# Patient Record
Sex: Female | Born: 1970 | Race: White | Hispanic: No | Marital: Married | Smoking: Former smoker
Health system: Southern US, Community
[De-identification: ages and names within clinical notes are randomized; demographics above are authoritative.]

## PROBLEM LIST (undated history)

## (undated) DIAGNOSIS — E079 Disorder of thyroid, unspecified: Secondary | ICD-10-CM

## (undated) DIAGNOSIS — E039 Hypothyroidism, unspecified: Secondary | ICD-10-CM

## (undated) HISTORY — PX: TONSILLECTOMY: SUR1361

## (undated) HISTORY — PX: WISDOM TOOTH EXTRACTION: SHX21

---

## 2011-08-02 ENCOUNTER — Inpatient Hospital Stay (HOSPITAL_COMMUNITY): Payer: BC Managed Care – PPO

## 2011-08-02 ENCOUNTER — Inpatient Hospital Stay (HOSPITAL_COMMUNITY)
Admission: EM | Admit: 2011-08-02 | Discharge: 2011-08-02 | DRG: 254 | Disposition: A | Discharge status: Home / Self Care | Payer: BC Managed Care – PPO | Attending: Emergency Medicine | Admitting: Emergency Medicine

## 2011-08-02 ENCOUNTER — Emergency Department (HOSPITAL_COMMUNITY): Payer: BC Managed Care – PPO

## 2011-08-02 ENCOUNTER — Encounter (HOSPITAL_COMMUNITY): Payer: Self-pay | Admitting: *Deleted

## 2011-08-02 DIAGNOSIS — W010XXA Fall on same level from slipping, tripping and stumbling without subsequent striking against object, initial encounter: Secondary | ICD-10-CM | POA: Diagnosis present

## 2011-08-02 DIAGNOSIS — X500XXA Overexertion from strenuous movement or load, initial encounter: Secondary | ICD-10-CM | POA: Diagnosis present

## 2011-08-02 DIAGNOSIS — Y9289 Other specified places as the place of occurrence of the external cause: Secondary | ICD-10-CM

## 2011-08-02 DIAGNOSIS — S82853A Displaced trimalleolar fracture of unspecified lower leg, initial encounter for closed fracture: Principal | ICD-10-CM | POA: Diagnosis present

## 2011-08-02 DIAGNOSIS — Z880 Allergy status to penicillin: Secondary | ICD-10-CM

## 2011-08-02 DIAGNOSIS — S82852A Displaced trimalleolar fracture of left lower leg, initial encounter for closed fracture: Secondary | ICD-10-CM

## 2011-08-02 HISTORY — DX: Disorder of thyroid, unspecified: E07.9

## 2011-08-02 MED ORDER — HYDROMORPHONE HCL PF 1 MG/ML IJ SOLN
1.0000 mg | Freq: Once | INTRAMUSCULAR | Status: AC
  Administered 2011-08-02: 1 mg via INTRAVENOUS
  Filled 2011-08-02 (×2): qty 1

## 2011-08-02 MED ORDER — HYDROMORPHONE HCL PF 1 MG/ML IJ SOLN: 1.0000 mg | Freq: Once | INTRAMUSCULAR | Status: DC

## 2011-08-02 MED ORDER — OXYCODONE-ACETAMINOPHEN 5-325 MG PO TABS: 1.0000 | ORAL_TABLET | Freq: Four times a day (QID) | ORAL | Status: AC | PRN

## 2011-08-02 MED ORDER — FENTANYL CITRATE 0.05 MG/ML IJ SOLN
50.0000 ug | Freq: Once | INTRAMUSCULAR | Status: AC
  Administered 2011-08-02: 50 ug via INTRAVENOUS

## 2011-08-02 MED ORDER — FENTANYL CITRATE 0.05 MG/ML IJ SOLN
INTRAMUSCULAR | Status: AC
  Filled 2011-08-02: qty 2

## 2011-08-02 MED ORDER — HYDROMORPHONE HCL PF 2 MG/ML IJ SOLN
2.0000 mg | Freq: Once | INTRAMUSCULAR | Status: AC
  Administered 2011-08-02: 2 mg via INTRAVENOUS
  Filled 2011-08-02: qty 1

## 2011-08-02 MED ORDER — SODIUM CHLORIDE 0.9 % IV SOLN: INTRAVENOUS | Status: DC

## 2011-08-02 MED ORDER — HYDROMORPHONE HCL PF 1 MG/ML IJ SOLN
1.0000 mg | INTRAMUSCULAR | Status: DC | PRN
  Administered 2011-08-02: 1 mg via INTRAVENOUS
  Filled 2011-08-02: qty 1

## 2011-08-02 MED ORDER — ONDANSETRON HCL 4 MG/2ML IJ SOLN
4.0000 mg | Freq: Once | INTRAMUSCULAR | Status: AC
  Administered 2011-08-02: 4 mg via INTRAVENOUS
  Filled 2011-08-02: qty 2

## 2011-08-02 MED ORDER — ONDANSETRON HCL 4 MG/2ML IJ SOLN
4.0000 mg | Freq: Once | INTRAMUSCULAR | Status: AC
  Administered 2011-08-02: 4 mg via INTRAVENOUS
  Filled 2011-08-02 (×2): qty 2

## 2011-08-02 MED ORDER — ONDANSETRON HCL 4 MG/2ML IJ SOLN: 4.0000 mg | Freq: Three times a day (TID) | INTRAMUSCULAR | Status: DC | PRN

## 2011-08-02 NOTE — ED Notes (Signed)
MD at bedside. 

## 2011-08-02 NOTE — Progress Notes (Signed)
Orthopedic Tech Progress Note Patient Details:  Raven Bradshaw May 21, 1970 [id number]  Ortho Devices Type of Ortho Device: Short leg splint Ortho Device/Splint Interventions: Application   Cammer, Mickie Bail 08/02/2011, 1:46 PM

## 2011-08-02 NOTE — ED Notes (Signed)
Pt reports walking down embankment, tripped, twisted left ankle. Obvious deformity. 20G IV L Hand. Pt given Fentanyl 100 mg IVP. BP 120/80 HR 70.

## 2011-08-02 NOTE — Discharge Instructions (Signed)
You have a fractured left ankle.  This is an unstable fracture.  Please do not bear any weight on that ankle.  Use crutches to move around.  Keep your leg elevated above your heart when laying down.  Apply ice to the front of your ankle at home.  Call and follow up with Dr. Lajoyce Corners tomorrow for further care.  Take pain medication as prescribed.    Ankle Fracture A fracture is a break in the bone. A cast or splint is used to protect and keep your injured bone from moving.  HOME CARE INSTRUCTIONS   Use your crutches as directed.   To lessen the swelling, keep the injured leg elevated while sitting or lying down.   Apply ice to the injury for 15 to 20 minutes, 3 to 4 times per day while awake for 2 days. Put the ice in a plastic bag and place a thin towel between the bag of ice and your cast.   If you have a plaster or fiberglass cast:   Do not try to scratch the skin under the cast using sharp or pointed objects.   Check the skin around the cast every day. You may put lotion on any red or sore areas.   Keep your cast dry and clean.   If you have a plaster splint:   Wear the splint as directed.   You may loosen the elastic around the splint if your toes become numb, tingle, or turn cold or blue.   Do not put pressure on any part of your cast or splint; it may break. Rest your cast only on a pillow the first 24 hours until it is fully hardened.   Your cast or splint can be protected during bathing with a plastic bag. Do not lower the cast or splint into water.   Take medications as directed by your caregiver. Only take over-the-counter or prescription medicines for pain, discomfort, or fever as directed by your caregiver.   Do not drive a vehicle until your caregiver specifically tells you it is safe to do so.   If your caregiver has given you a follow-up appointment, it is very important to keep that appointment. Not keeping the appointment could result in a chronic or permanent injury,  pain, and disability. If there is any problem keeping the appointment, you must call back to this facility for assistance.  SEEK IMMEDIATE MEDICAL CARE IF:   Your cast gets damaged or breaks.   You have continued severe pain or more swelling than you did before the cast was put on.   Your skin or toenails below the injury turn blue or gray, or feel cold or numb.   There is a bad smell or new stains and/or purulent (pus like) drainage coming from under the cast.  If you do not have a window in your cast for observing the wound, a discharge or minor bleeding may show up as a stain on the outside of your cast. Report these findings to your caregiver. MAKE SURE YOU:   Understand these instructions.   Will watch your condition.   Will get help right away if you are not doing well or get worse.  Document Released: 01/21/2000 Document Revised: 01/12/2011 Document Reviewed: 08/27/2007 [facility] Patient Information 2012 [place], [place].

## 2011-08-02 NOTE — ED Provider Notes (Signed)
History     CSN: [id number]  Arrival date & time 08/02/11  1142   First MD Initiated Contact with Patient 08/02/11 1149      Chief Complaint  Patient presents with  . Ankle Injury    (Consider location/radiation/quality/duration/timing/severity/associated sxs/prior treatment) HPI  41 year old female presents for evaluation of left ankle injury. Patient report she was walking down an embarkment when she tripped and twisted her left ankle outward.  Patient felt a pop, and was unable to bear any weight to her L ankle. She described pain as a sharp and throbbing sensation worsening with movement. She denies numbness. She denies breaking bones before. She denies any knee pain or hip pain. She denies any precipitating factors prior to fall.  Pt was brought to ER via EMS.  Pt was given Fentanyl 100mg  IVP prior to arrival.    Past Medical History  Diagnosis Date  . Thyroid disease     History reviewed. No pertinent past surgical history.  No family history on file.  History  Substance Use Topics  . Smoking status: Never Smoker   . Smokeless tobacco: Not on file  . Alcohol Use: No    OB History    Grav Para Term Preterm Abortions TAB SAB Ect Mult Living                  Review of Systems  Constitutional: Negative for fever.  Neurological: Negative for numbness.  All other systems reviewed and are negative.    Allergies  Penicillins  Home Medications  No current outpatient prescriptions on file.  BP 135/75  Pulse 73  Temp 97.7 F (36.5 C) (Oral)  Resp 20  SpO2 96%  Physical Exam  Nursing note and vitals reviewed. Constitutional: She is oriented to person, place, and time. She appears well-developed and well-nourished. No distress.  HENT:  Head: Atraumatic.  Eyes: Conjunctivae are normal.  Neck: Neck supple.  Musculoskeletal:       Left knee: Normal.       Left ankle: She exhibits decreased range of motion, swelling, ecchymosis and deformity. She exhibits no  laceration and normal pulse. tenderness. Lateral malleolus and medial malleolus tenderness found. No AITFL, no posterior TFL, no head of 5th metatarsal and no proximal fibula tenderness found.  Neurological: She is alert and oriented to person, place, and time.       Sensation intact distal to injury.  Brisk cap refills, able to move all toes  Skin: Skin is warm. No rash noted. No erythema.    ED Course  Procedures (including critical care time)  Labs Reviewed - No data to display No results found.   No diagnosis found.  No results found for this or any previous visit. Dg Ankle Complete Left  08/02/2011  *RADIOLOGY REPORT*  Clinical Data: Fall and left ankle pain.  LEFT ANKLE COMPLETE - 3+ VIEW  Comparison: None.  Findings: Three views of the left ankle demonstrate a complex fracture of the left ankle.  There is a fracture at the medial malleolus at the level of the ankle joint.  There is a displaced fracture involving the distal fibula.  There is disruption of the ankle mortise. There is marked soft tissue swelling, particularly along the lateral aspect.  There may be an avulsion type fracture along the dorsal aspect of the talus. There appears to be a fracture involving the posterior distal tibia.  IMPRESSION: Trimalleolar ankle fracture with disruption of the ankle mortise.  Original Report Authenticated By:  ADAM Lowella Dandy, M.D.      MDM  L ankle injury from a mechanical fall.  Deformity noted to L ankle without obvious dislocation.  No skin tinting. PD pulse 2+.  No pain to base of 5th metartasal. Xray ordered.   1:43 PM Xray reviewed by me showed a trimalleolar fx.  I have consulted with orthopedic, Dr. Lajoyce Corners who request to have L ankle reduced and splinted with outpatient follow up tomorrow.  Pt was given pain medication, post reduction without conscious sedation were performed by me and with ortho tech.  Posterior splint and sugartong splint were applied.  Post reduction xray ordered.   Pt tolerates procedure.     2:10 PM Patient reports feeling better after post reduction. She is neurovascularly intact postreduction and splinting. Post reduction xray shows no changes.  I discussed with my attending, who recommend removing splinting and resplint to ensure correct placement.  I discussed with patient, who is amenable with plan.    3:56 PM L ankle has been resplint to ensure appropriate alignment.  Pt tolerates procedure well.  Posterior splint and sugar tong splint placed.  Pt is neurovascularly intact post splinting.  Will not repeat xray at this time due to risk of increased radiation exposure.  Care instruction given.  Pt to f/u with ortho tomorrow was recommended.    Fayrene Helper, PA-C 08/02/11 1608

## 2011-08-02 NOTE — ED Notes (Signed)
Patient transported to X-ray 

## 2011-08-03 ENCOUNTER — Other Ambulatory Visit (HOSPITAL_COMMUNITY): Payer: Self-pay | Admitting: Orthopedic Surgery

## 2011-08-03 ENCOUNTER — Encounter (HOSPITAL_COMMUNITY): Payer: Self-pay | Admitting: Anesthesiology

## 2011-08-03 ENCOUNTER — Ambulatory Visit (HOSPITAL_COMMUNITY): Payer: BC Managed Care – PPO

## 2011-08-03 ENCOUNTER — Inpatient Hospital Stay (HOSPITAL_COMMUNITY)
Admission: AD | Admit: 2011-08-03 | Discharge: 2011-08-04 | DRG: 219 | Disposition: A | Discharge status: Home / Self Care | Payer: BC Managed Care – PPO | Source: Ambulatory Visit | Attending: Orthopedic Surgery | Admitting: Orthopedic Surgery

## 2011-08-03 ENCOUNTER — Ambulatory Visit (HOSPITAL_COMMUNITY): Payer: BC Managed Care – PPO | Admitting: Anesthesiology

## 2011-08-03 ENCOUNTER — Encounter (HOSPITAL_COMMUNITY)
Admission: AD | Disposition: A | Discharge status: Home / Self Care | Payer: Self-pay | Source: Ambulatory Visit | Attending: Orthopedic Surgery

## 2011-08-03 ENCOUNTER — Encounter (HOSPITAL_COMMUNITY): Payer: Self-pay | Admitting: *Deleted

## 2011-08-03 DIAGNOSIS — Z79899 Other long term (current) drug therapy: Secondary | ICD-10-CM

## 2011-08-03 DIAGNOSIS — S82853A Displaced trimalleolar fracture of unspecified lower leg, initial encounter for closed fracture: Principal | ICD-10-CM | POA: Diagnosis present

## 2011-08-03 DIAGNOSIS — S82852A Displaced trimalleolar fracture of left lower leg, initial encounter for closed fracture: Secondary | ICD-10-CM

## 2011-08-03 DIAGNOSIS — E039 Hypothyroidism, unspecified: Secondary | ICD-10-CM | POA: Diagnosis present

## 2011-08-03 DIAGNOSIS — Z6841 Body Mass Index (BMI) 40.0 and over, adult: Secondary | ICD-10-CM

## 2011-08-03 DIAGNOSIS — W010XXA Fall on same level from slipping, tripping and stumbling without subsequent striking against object, initial encounter: Secondary | ICD-10-CM | POA: Diagnosis present

## 2011-08-03 HISTORY — PX: ORIF ANKLE FRACTURE: SHX5408

## 2011-08-03 HISTORY — DX: Hypothyroidism, unspecified: E03.9

## 2011-08-03 LAB — COMPREHENSIVE METABOLIC PANEL
Alkaline Phosphatase: 54 U/L (ref 39–117)
BUN: 11 mg/dL (ref 6–23)
CO2: 23 mEq/L (ref 19–32)
Chloride: 104 mEq/L (ref 96–112)
Creatinine, Ser: 0.62 mg/dL (ref 0.50–1.10)
GFR calc non Af Amer: >90 mL/min (ref >90)
Glucose, Bld: 99 mg/dL (ref 70–99)
Potassium: 3.6 mEq/L (ref 3.5–5.1)
Total Bilirubin: 0.3 mg/dL (ref 0.3–1.2)

## 2011-08-03 LAB — CBC
HCT: 36 % (ref 36.0–46.0)
Hemoglobin: 11.7 g/dL — ABNORMAL LOW (ref 12.0–15.0)
MCV: 84.9 fL (ref 78.0–100.0)
RBC: 4.24 MIL/uL (ref 3.87–5.11)
WBC: 15 10*3/uL — ABNORMAL HIGH (ref 4.0–10.5)

## 2011-08-03 LAB — APTT: aPTT: 32 seconds (ref 24–37)

## 2011-08-03 LAB — PROTIME-INR
INR: 1.11 (ref 0.00–1.49)
Prothrombin Time: 14.5 seconds (ref 11.6–15.2)

## 2011-08-03 SURGERY — OPEN REDUCTION INTERNAL FIXATION (ORIF) ANKLE FRACTURE
Anesthesia: Regional | Site: Ankle | Laterality: Left | Wound class: Clean

## 2011-08-03 MED ORDER — PROPOFOL 10 MG/ML IV EMUL
INTRAVENOUS | Status: DC | PRN
  Administered 2011-08-03: 200 mg via INTRAVENOUS

## 2011-08-03 MED ORDER — HYDROMORPHONE HCL PF 1 MG/ML IJ SOLN
0.2500 mg | INTRAMUSCULAR | Status: DC | PRN
  Administered 2011-08-03: 0.5 mg via INTRAVENOUS

## 2011-08-03 MED ORDER — HYDROMORPHONE HCL PF 1 MG/ML IJ SOLN
INTRAMUSCULAR | Status: AC
  Administered 2011-08-03: 20:00:00
  Filled 2011-08-03: qty 1

## 2011-08-03 MED ORDER — ONDANSETRON HCL 4 MG/2ML IJ SOLN
INTRAMUSCULAR | Status: DC | PRN
  Administered 2011-08-03: 4 mg via INTRAVENOUS

## 2011-08-03 MED ORDER — MIDAZOLAM HCL 5 MG/5ML IJ SOLN
INTRAMUSCULAR | Status: DC | PRN
  Administered 2011-08-03: 2 mg via INTRAVENOUS

## 2011-08-03 MED ORDER — BUPIVACAINE-EPINEPHRINE PF 0.5-1:200000 % IJ SOLN
INTRAMUSCULAR | Status: DC | PRN
  Administered 2011-08-03: 30 mL

## 2011-08-03 MED ORDER — METOCLOPRAMIDE HCL 10 MG PO TABS: 5.0000 mg | ORAL_TABLET | Freq: Three times a day (TID) | ORAL | Status: DC | PRN

## 2011-08-03 MED ORDER — SODIUM CHLORIDE 0.9 % IV SOLN
INTRAVENOUS | Status: DC
  Administered 2011-08-04: 20 mL/h via INTRAVENOUS

## 2011-08-03 MED ORDER — 0.9 % SODIUM CHLORIDE (POUR BTL) OPTIME
TOPICAL | Status: DC | PRN
  Administered 2011-08-03: 1000 mL

## 2011-08-03 MED ORDER — CLINDAMYCIN PHOSPHATE 600 MG/50ML IV SOLN
600.0000 mg | Freq: Four times a day (QID) | INTRAVENOUS | Status: DC
  Administered 2011-08-04 (×2): 600 mg via INTRAVENOUS
  Filled 2011-08-03 (×3): qty 50

## 2011-08-03 MED ORDER — ACETAMINOPHEN 10 MG/ML IV SOLN
INTRAVENOUS | Status: AC
  Filled 2011-08-03: qty 100

## 2011-08-03 MED ORDER — METOCLOPRAMIDE HCL 5 MG/ML IJ SOLN: 5.0000 mg | Freq: Three times a day (TID) | INTRAMUSCULAR | Status: DC | PRN

## 2011-08-03 MED ORDER — DEXTROSE 5 % IV SOLN
INTRAVENOUS | Status: AC
  Filled 2011-08-03: qty 50

## 2011-08-03 MED ORDER — LEVOTHYROXINE SODIUM 200 MCG PO TABS
200.0000 ug | ORAL_TABLET | Freq: Every day | ORAL | Status: DC
  Administered 2011-08-04: 200 ug via ORAL
  Filled 2011-08-03: qty 1

## 2011-08-03 MED ORDER — FENTANYL CITRATE 0.05 MG/ML IJ SOLN
INTRAMUSCULAR | Status: DC | PRN
  Administered 2011-08-03 (×2): 100 ug via INTRAVENOUS

## 2011-08-03 MED ORDER — CLINDAMYCIN PHOSPHATE 600 MG/50ML IV SOLN
600.0000 mg | INTRAVENOUS | Status: AC
  Administered 2011-08-03: 600 mg via INTRAVENOUS
  Filled 2011-08-03: qty 50

## 2011-08-03 MED ORDER — ONDANSETRON HCL 4 MG PO TABS: 4.0000 mg | ORAL_TABLET | Freq: Four times a day (QID) | ORAL | Status: DC | PRN

## 2011-08-03 MED ORDER — ACETAMINOPHEN 10 MG/ML IV SOLN
INTRAVENOUS | Status: DC | PRN
  Administered 2011-08-03: 1000 mg via INTRAVENOUS

## 2011-08-03 MED ORDER — LACTATED RINGERS IV SOLN
INTRAVENOUS | Status: DC | PRN
  Administered 2011-08-03: 17:00:00 via INTRAVENOUS

## 2011-08-03 MED ORDER — ONDANSETRON HCL 4 MG/2ML IJ SOLN: 4.0000 mg | Freq: Four times a day (QID) | INTRAMUSCULAR | Status: DC | PRN

## 2011-08-03 MED ORDER — LIDOCAINE HCL (CARDIAC) 20 MG/ML IV SOLN
INTRAVENOUS | Status: DC | PRN
  Administered 2011-08-03: 100 mg via INTRAVENOUS

## 2011-08-03 MED ORDER — HYDROMORPHONE HCL PF 1 MG/ML IJ SOLN: 0.5000 mg | INTRAMUSCULAR | Status: DC | PRN

## 2011-08-03 MED ORDER — HYDROCODONE-ACETAMINOPHEN 5-325 MG PO TABS: 1.0000 | ORAL_TABLET | ORAL | Status: DC | PRN

## 2011-08-03 MED ORDER — OXYCODONE-ACETAMINOPHEN 5-325 MG PO TABS
1.0000 | ORAL_TABLET | ORAL | Status: DC | PRN
  Administered 2011-08-03 – 2011-08-04 (×2): 2 via ORAL
  Filled 2011-08-03 (×2): qty 2

## 2011-08-03 MED ORDER — CLINDAMYCIN PHOSPHATE 600 MG/4ML IJ SOLN
INTRAMUSCULAR | Status: AC
Start: 2011-08-03 — End: 2011-08-03
  Filled 2011-08-03: qty 4

## 2011-08-03 MED ORDER — MUPIROCIN 2 % EX OINT
TOPICAL_OINTMENT | Freq: Once | CUTANEOUS | Status: DC
  Filled 2011-08-03: qty 22

## 2011-08-03 MED ORDER — METHOCARBAMOL 100 MG/ML IJ SOLN
500.0000 mg | Freq: Four times a day (QID) | INTRAVENOUS | Status: DC | PRN
  Administered 2011-08-03: 500 mg via INTRAVENOUS
  Filled 2011-08-03: qty 5

## 2011-08-03 MED ORDER — METHOCARBAMOL 500 MG PO TABS
500.0000 mg | ORAL_TABLET | Freq: Four times a day (QID) | ORAL | Status: DC | PRN
  Filled 2011-08-03: qty 1

## 2011-08-03 SURGICAL SUPPLY — 54 items
BANDAGE ESMARK 6X9 LF (GAUZE/BANDAGES/DRESSINGS) IMPLANT
BANDAGE GAUZE ELAST BULKY 4 IN (GAUZE/BANDAGES/DRESSINGS) ×2 IMPLANT
BIT DRILL 2.5X110 QC LCP DISP (BIT) ×2 IMPLANT
BIT DRILL 2.8 (BIT) ×1
BIT DRILL CANN QC 2.8X165 (BIT) ×1 IMPLANT
BNDG COHESIVE 4X5 TAN STRL (GAUZE/BANDAGES/DRESSINGS) ×2 IMPLANT
BNDG ESMARK 6X9 LF (GAUZE/BANDAGES/DRESSINGS)
BNDG GAUZE STRTCH 6 (GAUZE/BANDAGES/DRESSINGS) ×2 IMPLANT
CLOTH BEACON ORANGE TIMEOUT ST (SAFETY) ×2 IMPLANT
COVER SURGICAL LIGHT HANDLE (MISCELLANEOUS) ×2 IMPLANT
CUFF TOURNIQUET SINGLE 34IN LL (TOURNIQUET CUFF) IMPLANT
CUFF TOURNIQUET SINGLE 44IN (TOURNIQUET CUFF) IMPLANT
DRAPE C-ARM MINI 42X72 WSTRAPS (DRAPES) IMPLANT
DRAPE INCISE IOBAN 66X45 STRL (DRAPES) ×2 IMPLANT
DRAPE PROXIMA HALF (DRAPES) ×2 IMPLANT
DRAPE U-SHAPE 47X51 STRL (DRAPES) ×2 IMPLANT
DRILL BIT 2.8MM (BIT) ×1
DRSG ADAPTIC 3X8 NADH LF (GAUZE/BANDAGES/DRESSINGS) ×2 IMPLANT
DRSG PAD ABDOMINAL 8X10 ST (GAUZE/BANDAGES/DRESSINGS) ×2 IMPLANT
DURAPREP 26ML APPLICATOR (WOUND CARE) ×2 IMPLANT
ELECT REM PT RETURN 9FT ADLT (ELECTROSURGICAL) ×2
ELECTRODE REM PT RTRN 9FT ADLT (ELECTROSURGICAL) ×1 IMPLANT
GLOVE BIOGEL PI IND STRL 9 (GLOVE) ×1 IMPLANT
GLOVE BIOGEL PI INDICATOR 9 (GLOVE) ×1
GLOVE SURG ORTHO 9.0 STRL STRW (GLOVE) ×2 IMPLANT
GOWN PREVENTION PLUS XLARGE (GOWN DISPOSABLE) ×2 IMPLANT
GOWN SRG XL XLNG 56XLVL 4 (GOWN DISPOSABLE) ×2 IMPLANT
GOWN STRL NON-REIN XL XLG LVL4 (GOWN DISPOSABLE) ×2
KIT 1/3 TUB PL 7H 85M (Orthopedic Implant) ×1 IMPLANT
KIT BASIN OR (CUSTOM PROCEDURE TRAY) ×2 IMPLANT
KIT ROOM TURNOVER OR (KITS) ×2 IMPLANT
MANIFOLD NEPTUNE II (INSTRUMENTS) ×2 IMPLANT
NS IRRIG 1000ML POUR BTL (IV SOLUTION) ×2 IMPLANT
PACK ORTHO EXTREMITY (CUSTOM PROCEDURE TRAY) ×2 IMPLANT
PAD ARMBOARD 7.5X6 YLW CONV (MISCELLANEOUS) ×4 IMPLANT
PADDING CAST COTTON 6X4 STRL (CAST SUPPLIES) ×2 IMPLANT
PROS 1/3 TUB PL 7H 85M (Orthopedic Implant) ×2 IMPLANT
SCREW CANC FT 4.0X35 (Screw) ×6 IMPLANT
SCREW CANC PT 4.0X40 (Screw) ×1 IMPLANT
SCREW CANC PT 40X14X4X6X (Screw) ×1 IMPLANT
SCREW CORTEX 3.5 22MM (Screw) ×1 IMPLANT
SCREW LOCK CORT ST 3.5X22 (Screw) ×1 IMPLANT
SCREW LOCK T15 FT 12X3.5X2.9X (Screw) ×2 IMPLANT
SCREW LOCKING 3.5X12 (Screw) ×2 IMPLANT
SPONGE GAUZE 4X4 12PLY (GAUZE/BANDAGES/DRESSINGS) ×2 IMPLANT
SPONGE LAP 18X18 X RAY DECT (DISPOSABLE) ×2 IMPLANT
STAPLER VISISTAT 35W (STAPLE) IMPLANT
SUCTION FRAZIER TIP 10 FR DISP (SUCTIONS) ×2 IMPLANT
SUT ETHILON 2 0 PSLX (SUTURE) IMPLANT
SUT VIC AB 2-0 CTB1 (SUTURE) ×4 IMPLANT
TOWEL OR 17X24 6PK STRL BLUE (TOWEL DISPOSABLE) ×2 IMPLANT
TOWEL OR 17X26 10 PK STRL BLUE (TOWEL DISPOSABLE) ×2 IMPLANT
TUBE CONNECTING 12X1/4 (SUCTIONS) ×2 IMPLANT
WATER STERILE IRR 1000ML POUR (IV SOLUTION) ×2 IMPLANT

## 2011-08-03 NOTE — H&P (Signed)
Raven Bradshaw is an 41 y.o. female.   Chief Complaint: Trimalleolar fracture dislocation left ankle HPI: Patient is a 41 year old woman who slipped in her flip-flops on wet grass sustaining a supination external rotation injuries and sustained a closed trimalleolar Weber B. fracture of the left ankle. Patient was seen in emergency room last night did undergo closed reduction however presentation today she still had a dislocated ankle without any improvement of the reduction after her close reduction. Do to the dislocation alignment ankle patient is brought urgently to the operating room at this time to minimize risk of skin ischemia skin breakdown.  Past Medical History  Diagnosis Date  . Thyroid disease   . Hypothyroidism     Past Surgical History  Procedure Date  . Wisdom tooth extraction   . Tonsillectomy     History reviewed. No pertinent family history. Social History:  reports that she quit smoking about 16 years ago. Her smoking use included Cigarettes. She smoked .25 packs per day. She does not have any smokeless tobacco history on file. She reports that she does not drink alcohol or use illicit drugs.  Allergies:  Allergies  Allergen Reactions  . Penicillins Anaphylaxis    Medications Prior to Admission  Medication Sig Dispense Refill  . levothyroxine (SYNTHROID, LEVOTHROID) 200 MCG tablet Take 200 mcg by mouth daily.      Marland Kitchen oxyCODONE-acetaminophen (PERCOCET) 5-325 MG per tablet Take 1-2 tablets by mouth every 6 (six) hours as needed for pain.  20 tablet  0    Results for orders placed during the hospital encounter of 08/03/11 (from the past 48 hour(s))  SURGICAL PCR SCREEN     Status: Normal   Collection Time   08/03/11  2:05 PM      Component Value Range Comment   MRSA, PCR NEGATIVE  NEGATIVE    Staphylococcus aureus NEGATIVE  NEGATIVE   APTT     Status: Normal   Collection Time   08/03/11  2:09 PM      Component Value Range Comment   aPTT 32  24 - 37 seconds     CBC     Status: Abnormal   Collection Time   08/03/11  2:09 PM      Component Value Range Comment   WBC 15.0 (*) 4.0 - 10.5 K/uL    RBC 4.24  3.87 - 5.11 MIL/uL    Hemoglobin 11.7 (*) 12.0 - 15.0 g/dL    HCT 16.1  09.6 - 04.5 %    MCV 84.9  78.0 - 100.0 fL    MCH 27.6  26.0 - 34.0 pg    MCHC 32.5  30.0 - 36.0 g/dL    RDW 40.9  81.1 - 91.4 %    Platelets 250  150 - 400 K/uL   COMPREHENSIVE METABOLIC PANEL     Status: Abnormal   Collection Time   08/03/11  2:09 PM      Component Value Range Comment   Sodium 140  135 - 145 mEq/L    Potassium 3.6  3.5 - 5.1 mEq/L    Chloride 104  96 - 112 mEq/L    CO2 23  19 - 32 mEq/L    Glucose, Bld 99  70 - 99 mg/dL    BUN 11  6 - 23 mg/dL    Creatinine, Ser 7.82  0.50 - 1.10 mg/dL    Calcium 8.9  8.4 - 95.6 mg/dL    Total Protein 7.0  6.0 - 8.3 g/dL  Albumin 3.4 (*) 3.5 - 5.2 g/dL    AST 18  0 - 37 U/L    ALT 18  0 - 35 U/L    Alkaline Phosphatase 54  39 - 117 U/L    Total Bilirubin 0.3  0.3 - 1.2 mg/dL    GFR calc non Af Amer >90  >90 mL/min    GFR calc Af Amer >90  >90 mL/min   PROTIME-INR     Status: Normal   Collection Time   08/03/11  2:09 PM      Component Value Range Comment   Prothrombin Time 14.5  11.6 - 15.2 seconds    INR 1.11  0.00 - 1.49   HCG, SERUM, QUALITATIVE     Status: Normal   Collection Time   08/03/11  2:09 PM      Component Value Range Comment   Preg, Serum NEGATIVE  NEGATIVE    Dg Chest 2 View  08/03/2011  *RADIOLOGY REPORT*  Clinical Data: Preop ankle surgery.  CHEST - 2 VIEW  Comparison: None  Findings: Heart and mediastinal contours are within normal limits. No focal opacities or effusions.  No acute bony abnormality.  IMPRESSION: No active cardiopulmonary disease.  Original Report Authenticated By: Cyndie Chime, M.D.   Dg Ankle Complete Left  08/02/2011  *RADIOLOGY REPORT*  Clinical Data: Ankle fracture.  Postreduction film.  LEFT ANKLE COMPLETE - 3+ VIEW  Comparison: Ankle radiographs 08/02/2011.   Findings: Again noted is a trimalleolar fracture of the left ankle, with significant displacement and angulation of all of the fracture fragments.  There continues to be some posterior subluxation of the talus relative to the distal tibia.  Overlying soft tissue swelling is also noted.  No significant improvement in alignment is appreciated.  Overlying splint material is now in place.  IMPRESSION: 1.  No significant change in alignment of unstable trimalleolar fracture of the left ankle following splint placement, as above.  Original Report Authenticated By: Florencia Reasons, M.D.   Dg Ankle Complete Left  08/02/2011  *RADIOLOGY REPORT*  Clinical Data: Fall and left ankle pain.  LEFT ANKLE COMPLETE - 3+ VIEW  Comparison: None.  Findings: Three views of the left ankle demonstrate a complex fracture of the left ankle.  There is a fracture at the medial malleolus at the level of the ankle joint.  There is a displaced fracture involving the distal fibula.  There is disruption of the ankle mortise. There is marked soft tissue swelling, particularly along the lateral aspect.  There may be an avulsion type fracture along the dorsal aspect of the talus. There appears to be a fracture involving the posterior distal tibia.  IMPRESSION: Trimalleolar ankle fracture with disruption of the ankle mortise.  Original Report Authenticated By: Richarda Overlie, M.D.    Review of Systems  All other systems reviewed and are negative.    Blood pressure 141/78, pulse 73, temperature 98 F (36.7 C), temperature source Oral, resp. rate 20, height 5\' 2"  (1.575 m), weight 99.791 kg (220 lb), last menstrual period 07/12/2011. Physical Exam  On examination patient has a good dorsalis pedis pulse there were no fracture blisters there is no tenting of the skin. Radiograph shows a displaced trimalleolar Weber B. fracture of the left ankle. Assessment/Plan Assessment: Displaced Weber B. trimalleolar left ankle fracture.  Plan: Will  plan for open reduction internal fixation of the medial and lateral malleolar I. Risks and benefits were discussed including infection neurovascular injury persistent pain DVT pulmonary  embolus need for additional surgery. Patient states she understands and wishes to proceed at this time.  Rosine Solecki V 08/03/2011, 5:45 PM

## 2011-08-03 NOTE — Anesthesia Preprocedure Evaluation (Signed)
Anesthesia Evaluation  Patient identified by MRN, date of birth, ID band Patient awake    Reviewed: Allergy & Precautions, H&P , NPO status , Patient's Chart, lab work & pertinent test results  Airway Mallampati: II  Neck ROM: full    Dental   Pulmonary former smoker         Cardiovascular     Neuro/Psych    GI/Hepatic   Endo/Other  Hypothyroidism Morbid obesity  Renal/GU      Musculoskeletal   Abdominal   Peds  Hematology   Anesthesia Other Findings   Reproductive/Obstetrics                           Anesthesia Physical Anesthesia Plan  ASA: II  Anesthesia Plan: General and Regional   Post-op Pain Management: MAC Combined w/ Regional for Post-op pain   Induction: Intravenous  Airway Management Planned: LMA  Additional Equipment:   Intra-op Plan:   Post-operative Plan:   Informed Consent: I have reviewed the patients History and Physical, chart, labs and discussed the procedure including the risks, benefits and alternatives for the proposed anesthesia with the patient or authorized representative who has indicated his/her understanding and acceptance.     Plan Discussed with: CRNA and Surgeon  Anesthesia Plan Comments:         Anesthesia Quick Evaluation

## 2011-08-03 NOTE — Transfer of Care (Signed)
Immediate Anesthesia Transfer of Care Note  Patient: Raven Bradshaw  Procedure(s) Performed: Procedure(s) (LRB): OPEN REDUCTION INTERNAL FIXATION (ORIF) ANKLE FRACTURE (Left)  Patient Location: PACU  Anesthesia Type: General  Level of Consciousness: awake, alert  and oriented  Airway & Oxygen Therapy: Patient Spontanous Breathing and Patient connected to nasal cannula oxygen  Post-op Assessment: Report given to PACU RN, Post -op Vital signs reviewed and stable and Patient moving all extremities  Post vital signs: Reviewed and stable  Complications: No apparent anesthesia complications

## 2011-08-03 NOTE — Anesthesia Procedure Notes (Signed)
Anesthesia Regional Block:  Popliteal block  Pre-Anesthetic Checklist: ,, timeout performed, Correct Patient, Correct Site, Correct Laterality, Correct Procedure, Correct Position, site marked, Risks and benefits discussed,  Surgical consent,  Pre-op evaluation,  At surgeon's request and post-op pain management  Laterality: Left  Prep: chloraprep       Needles:  Injection technique: Single-shot  Needle Type: Echogenic Stimulator Needle     Needle Length:cm 9 cm Needle Gauge: 21 G    Additional Needles:  Procedures: ultrasound guided and nerve stimulator Popliteal block  Nerve Stimulator or Paresthesia:  Response: plantar flexion of foot, 0.45 mA,   Additional Responses:   Narrative:  Start time: 08/03/2011 4:55 PM End time: 08/03/2011 5:11 PM Injection made incrementally with aspirations every 5 mL.  Performed by: Personally  Anesthesiologist: Dr Chaney Malling  Additional Notes: Functioning IV was confirmed and monitors were applied.  A 90mm 21ga Arrow echogenic stimulator needle was used. Sterile prep and drape,hand hygiene and sterile gloves were used.  Negative aspiration and negative test dose prior to incremental administration of local anesthetic. The patient tolerated the procedure well.  Ultrasound guidance: relevent anatomy identified, needle position confirmed, local anesthetic spread visualized around nerve(s), vascular puncture avoided.  Image printed for medical record.   Popliteal block

## 2011-08-03 NOTE — Preoperative (Signed)
Beta Blockers   Reason not to administer Beta Blockers:Not Applicable 

## 2011-08-03 NOTE — Op Note (Signed)
OPERATIVE REPORT  DATE OF SURGERY: 08/03/2011  PATIENT:  Raven Bradshaw,  41 y.o. female  PRE-OPERATIVE DIAGNOSIS:  Trimalleolar Left Ankle Fracture  POST-OPERATIVE DIAGNOSIS:  Trimalleolar Left ankle fracture  PROCEDURE:  Procedure(s): OPEN REDUCTION INTERNAL FIXATION (ORIF) ANKLE FRACTURE  SURGEON:  Surgeon(s): Nadara Mustard, MD  ANESTHESIA:   regional and general  EBL:  Minimal ML  SPECIMEN:  No Specimen  TOURNIQUET:  * No tourniquets in log *  PROCEDURE DETAILS: Patient is a 41 year old woman who slipped on wet grass yesterday sustaining a Weber B. trimalleolar left ankle fracture. She was immobilized from the emergency room brought to my office radiographs showed a persistent dislocated ankle in a splint and she was brought at this time urgently for open reduction internal fixation. Risks and benefits were discussed including infection neurovascular injury nonhealing of the skin nonhealing of bone arthritis DVT pulmonary embolus need for additional surgery. Patient states he understands was pursued this time. Should procedure patient underwent a popliteal block in the holding area of the she was then brought to or room 6 after general anesthetic patient's left lower extremity was prepped using DuraPrep and draped into a sterile field. A incision was made laterally of the fibula this was carried sharply down to bone the fracture was freshened reduced and stabilized with an interfrag 22 mm cortical screw. A 7-hole anti-glide plate was then placed posterior laterally and this secured proximally with 3 nonlocking screws and distally with 2 locking screws. C-arm fluoroscopy verified reduction in the subcutaneous is closed in 2-0 Vicryl the skin was closed using approximate staples. This was employed focused medially. A longitudinal incision was made over the medial malleolus defect fracture edges were freshened this was reduced and stabilized with a 40 mm 4.0 partially threaded cancellus  screw. C-arm fluoroscopy verified reduction of the mortise. The wounds were irrigated subcutaneous is closed using 2-0 Vicryl the skin was closed with approximate staples the wound was covered with Adaptic orthopedic sponges AB dressing Kerlix and Coban. Patient was extubated taken to the PACU in stable condition.  PLAN OF CARE: Admit to inpatient   PATIENT DISPOSITION:  PACU - hemodynamically stable.   Nadara Mustard, MD 08/03/2011 7:08 PM

## 2011-08-03 NOTE — Anesthesia Postprocedure Evaluation (Signed)
  Anesthesia Post-op Note  Patient: Raven Bradshaw  Procedure(s) Performed: Procedure(s) (LRB): OPEN REDUCTION INTERNAL FIXATION (ORIF) ANKLE FRACTURE (Left)  Patient Location: PACU  Anesthesia Type: GA combined with regional for post-op pain  Level of Consciousness: awake, alert  and oriented  Airway and Oxygen Therapy: Patient Spontanous Breathing and Patient connected to nasal cannula oxygen  Post-op Pain: none  Post-op Assessment: Post-op Vital signs reviewed  Post-op Vital Signs: Reviewed  Complications: No apparent anesthesia complications

## 2011-08-03 NOTE — Progress Notes (Signed)
Orthopedic Tech Progress Note Patient Details:  Raven Bradshaw [birth year] [id number]  Ortho Devices Type of Ortho Device: CAM walker Ortho Device/Splint Location: left foot Ortho Device/Splint Interventions: Raven Bradshaw, Raven Bradshaw 08/03/2011, 9:43 PM

## 2011-08-04 ENCOUNTER — Encounter (HOSPITAL_COMMUNITY): Payer: Self-pay | Admitting: Orthopedic Surgery

## 2011-08-04 MED ORDER — OXYCODONE-ACETAMINOPHEN 5-325 MG PO TABS: 1.0000 | ORAL_TABLET | ORAL | Status: AC | PRN

## 2011-08-04 MED FILL — Mupirocin Oint 2%: CUTANEOUS | Qty: 22 | Status: AC

## 2011-08-04 NOTE — Discharge Instructions (Signed)
Keep foot elevated above the heart. Okay to work on range of motion of the ankle and toes. Nonweightbearing left lower extremity.

## 2011-08-04 NOTE — Progress Notes (Signed)
CARE MANAGEMENT NOTE 08/04/2011  Patient:  Raven Bradshaw, Raven Bradshaw   Account Number:  [id number]  Date Initiated:  08/04/2011  Documentation initiated by:  Vance Peper  Subjective/Objective Assessment:   41 yr old female s/p ORIF of left ankle fracture.     Action/Plan:   CM spoke with patient regarding DME needs.   Anticipated DC Date:  08/04/2011   Anticipated DC Plan:  HOME/SELF CARE      DC Planning Services  CM consult      PAC Choice  DURABLE MEDICAL EQUIPMENT   Choice offered to / List presented to:  NA   DME arranged  Levan Hurst      DME agency  [facility]        Status of service:  Completed, signed off  Discharge Disposition:  HOME/SELF CARE

## 2011-08-04 NOTE — ED Provider Notes (Signed)
Medical screening examination/treatment/procedure(s) were performed by non-physician practitioner and as supervising physician I was immediately available for consultation/collaboration.  Ethelda Chick, MD 08/04/11 [phone]

## 2011-08-04 NOTE — Evaluation (Signed)
Physical Therapy Evaluation Patient Details Name: Raven Bradshaw MRN: [id number] DOB: 17-Sep-1970 Today's Date: 08/04/2011 Time: 4098-1191 PT Time Calculation (min): 45 min  PT Assessment / Plan / Recommendation Clinical Impression  Pt is a 41 y/o female admitted s/p left ankle ORIF along with the below PT problem list.  Pt would benefit from acute PT to maximize independence and facilitate d/c home.    PT Assessment  Patient needs continued PT services    Follow Up Recommendations  No PT follow up    Barriers to Discharge None      Equipment Recommendations  Rolling walker with 5" wheels    Recommendations for Other Services     Frequency Min 5X/week    Precautions / Restrictions Precautions Precautions: Fall Restrictions Weight Bearing Restrictions: Yes LLE Weight Bearing: Non weight bearing   Pertinent Vitals/Pain 4/10 in left ankle.  Pt repositioned and premedicated.      Mobility  Bed Mobility Bed Mobility: Supine to Sit Supine to Sit: 4: Min assist;HOB flat Details for Bed Mobility Assistance: Assist for left LE due to pain with cues for sequence. Transfers Transfers: Sit to Stand;Stand to Sit (2 trials.) Sit to Stand: 5: Supervision;With upper extremity assist;From bed;From chair/3-in-1 Stand to Sit: 5: Supervision;With upper extremity assist;To chair/3-in-1 Details for Transfer Assistance: Verbal cues for safest hand placement and to maintain NWBing left LE. Ambulation/Gait Ambulation/Gait Assistance: 4: Min guard Ambulation Distance (Feet): 50 Feet (40 feet and then 10 feet.) Assistive device: Rolling walker Ambulation/Gait Assistance Details: Guarding for balance with cues for safest sequence in order to maintain NWBing left LE. Gait Pattern: Step-to pattern;Decreased step length - right;Decreased stance time - right Stairs: Yes Stairs Assistance: 4: Min assist Stairs Assistance Details (indicate cue type and reason): Assist for balance and to steady RW  with cues for safest sequence using backwards with RW. Stair Management Technique: Step to pattern;Backwards;With walker Number of Stairs: 2  Wheelchair Mobility Wheelchair Mobility: No    Exercises     PT Diagnosis: Difficulty walking;Acute pain  PT Problem List: Decreased strength;Decreased activity tolerance;Decreased balance;Decreased mobility;Decreased knowledge of use of DME;Decreased knowledge of precautions;Pain PT Treatment Interventions:     PT Goals Acute Rehab PT Goals PT Goal Formulation: With patient/family Time For Goal Achievement: 08/11/11 Potential to Achieve Goals: Good Pt will go Supine/Side to Sit: with modified independence PT Goal: Supine/Side to Sit - Progress: Goal set today Pt will go Sit to Supine/Side: with modified independence PT Goal: Sit to Supine/Side - Progress: Goal set today Pt will go Sit to Stand: with modified independence PT Goal: Sit to Stand - Progress: Goal set today Pt will go Stand to Sit: with modified independence PT Goal: Stand to Sit - Progress: Goal set today Pt will Ambulate: >150 feet;with modified independence;with least restrictive assistive device PT Goal: Ambulate - Progress: Goal set today Pt will Go Up / Down Stairs: Flight;with min assist;with least restrictive assistive device PT Goal: Up/Down Stairs - Progress: Goal set today  Visit Information  Last PT Received On: 08/04/11 Assistance Needed: +1    Subjective Data  Subjective: "Sounds good." Patient Stated Goal: Go home.   Prior Functioning  Home Living Lives With: Spouse;Son Available Help at Discharge: Family Type of Home: Apartment Home Access: Stairs to enter Secretary/administrator of Steps: 10 Entrance Stairs-Rails: Right;Left;Can reach both Home Layout: One level Bathroom Shower/Tub: Tub/shower unit;Curtain Firefighter: Standard Home Adaptive Equipment: Crutches Prior Function Level of Independence: Independent with assistive device(s) Able to  Take  Stairs?: Yes Driving: Yes Communication Communication: No difficulties    Cognition  Overall Cognitive Status: Appears within functional limits for tasks assessed/performed Arousal/Alertness: Awake/alert Orientation Level: Appears intact for tasks assessed Behavior During Session: So [facility] for tasks performed    Extremity/Trunk Assessment Right Upper Extremity Assessment RUE ROM/Strength/Tone: Within functional levels RUE Sensation: WFL - Light Touch RUE Coordination: WFL - gross/fine motor Left Upper Extremity Assessment LUE ROM/Strength/Tone: Within functional levels LUE Sensation: WFL - Light Touch LUE Coordination: WFL - gross/fine motor Right Lower Extremity Assessment RLE ROM/Strength/Tone: Within functional levels RLE Sensation: WFL - Light Touch RLE Coordination: WFL - gross/fine motor Left Lower Extremity Assessment LLE ROM/Strength/Tone: Deficits;Due to pain LLE ROM/Strength/Tone Deficits: 3/5 throughout.  Ankle not tested. LLE Sensation: WFL - Light Touch LLE Coordination: WFL - gross/fine motor Trunk Assessment Trunk Assessment: Normal   Balance Balance Balance Assessed: No  End of Session PT - End of Session Equipment Utilized During Treatment: Gait belt;Other (comment) (CAM Walker left LE.) Activity Tolerance: Patient tolerated treatment well Patient left: in chair;with call bell/phone within reach;with family/visitor present Nurse Communication: Mobility status;Weight bearing status  GP     Cephus Shelling 08/04/2011, 1:45 PM  08/04/2011 Cephus Shelling, PT, DPT [phone]

## 2011-08-04 NOTE — Discharge Summary (Signed)
Physician Discharge Summary  Patient ID: Leda Bellefeuille MRN: [id number] DOB/AGE: 43-15-1972 43 y.o.  Admit date: 08/03/2011 Discharge date: 08/04/2011  Admission Diagnoses: Trimalleolar left ankle fracture  Discharge Diagnoses: Same Active Problems:  * No active hospital problems. *    Discharged Condition: stable  Hospital Course: Patient's hospital course was essentially unremarkable she was admitted underwent open reduction internal fixation of the left ankle. Postoperatively patient was stable and was discharged to home after physical therapy in stable condition.  Consults: None  Significant Diagnostic Studies: labs: Routine labs  Treatments: surgery: Please see operative note  Discharge Exam: Blood pressure 150/76, pulse 78, temperature 97.6 F (36.4 C), temperature source Oral, resp. rate 16, height 5\' 2"  (1.575 m), weight 99.791 kg (220 lb), last menstrual period 07/12/2011, SpO2 99.00%. Incision/Wound: dressing clean and dry at time of discharge  Disposition: 01-Home or Self Care  Discharge Orders    Future Orders Please Complete By Expires   Diet - low sodium heart healthy      Call MD / Call 911      Comments:   If you experience chest pain or shortness of breath, CALL 911 and be transported to the hospital emergency room.  If you develope a fever above 101 F, pus (white drainage) or increased drainage or redness at the wound, or calf pain, call your surgeon's office.   Constipation Prevention      Comments:   Drink plenty of fluids.  Prune juice may be helpful.  You may use a stool softener, such as Colace (over the counter) 100 mg twice a day.  Use MiraLax (over the counter) for constipation as needed.   Increase activity slowly as tolerated        Medication List  As of 08/04/2011  6:18 AM   TAKE these medications         levothyroxine 200 MCG tablet   Commonly known as: SYNTHROID, LEVOTHROID   Take 200 mcg by mouth daily.      oxyCODONE-acetaminophen  5-325 MG per tablet   Commonly known as: PERCOCET   Take 1-2 tablets by mouth every 6 (six) hours as needed for pain.      oxyCODONE-acetaminophen 5-325 MG per tablet   Commonly known as: PERCOCET   Take 1 tablet by mouth every 4 (four) hours as needed for pain.           Follow-up Information    Follow up with Laken Rog V, MD in 1 week.   Contact information:   [address] [place] [address] [phone]          Signed: Nadara Mustard 08/04/2011, 6:18 AM

## 2011-08-04 NOTE — Progress Notes (Signed)
Utilization review completed. Thuy Atilano, RN, BSN. 

## 2012-08-22 ENCOUNTER — Ambulatory Visit (INDEPENDENT_AMBULATORY_CARE_PROVIDER_SITE_OTHER): Payer: BC Managed Care – PPO | Admitting: Endocrinology

## 2012-08-22 ENCOUNTER — Other Ambulatory Visit: Payer: Self-pay | Admitting: *Deleted

## 2012-08-22 ENCOUNTER — Encounter: Payer: Self-pay | Admitting: Endocrinology

## 2012-08-22 VITALS — BP 130/86 | HR 78 | Temp 98.0°F | Resp 12 | Ht 62.0 in | Wt 234.4 lb

## 2012-08-22 DIAGNOSIS — E042 Nontoxic multinodular goiter: Secondary | ICD-10-CM

## 2012-08-22 DIAGNOSIS — E785 Hyperlipidemia, unspecified: Secondary | ICD-10-CM

## 2012-08-22 DIAGNOSIS — E119 Type 2 diabetes mellitus without complications: Secondary | ICD-10-CM

## 2012-08-22 DIAGNOSIS — E039 Hypothyroidism, unspecified: Secondary | ICD-10-CM | POA: Insufficient documentation

## 2012-08-22 LAB — T4, FREE: Free T4: 1.13 ng/dL (ref 0.60–1.60)

## 2012-08-22 LAB — TSH: TSH: 4.51 u[IU]/mL (ref 0.35–5.50)

## 2012-08-22 NOTE — Patient Instructions (Signed)
Will change to Armour thyroid

## 2012-08-22 NOTE — Progress Notes (Signed)
Patient ID: Raven Bradshaw, female   DOB: 1971-01-01, 42 y.o.   MRN: [id number]  Reason for Appointment:  Hypothyroidism, new visit    History of Present Illness:   The Hyothyroidism was first diagnosed  about 10 years ago    The symptoms consistent with hypothyroidism at the time of diagnosis were: fatigue, difficulty with weight loss, hair loss .          She was first treatedby her primary care physician and initially took only 25 mcg but this dosage was progressively increased over the years. The treatments that the patient has taken recently has been 175 microgram Synthroid. This has not been changed for sometime. She prefers the brand name and she did not feel well on generic  Her thyroid level was checked last in January and was normal and has generally been monitored every 6 months       She complains of feeling tired still, tendency to weight gain but no hair loss or cold intolerance. She does not think she has felt normal since she has had hypothyroidism and is here for further consultation. She also complains of mood swings, insomnia and feeling irritable  The last TSH was performed  on 02/14/12 and the result was 3.08  Takes her Synthroid regularly in the early morning and has been quite compliant with it   Past Medical History  Diagnosis Date  . Thyroid disease   . Hypothyroidism     Past Surgical History  Procedure Laterality Date  . Wisdom tooth extraction    . Tonsillectomy    . Orif ankle fracture  08/03/2011    Procedure: OPEN REDUCTION INTERNAL FIXATION (ORIF) ANKLE FRACTURE;  Surgeon: Nadara Mustard, MD;  Location: [facility] OR;  Service: Orthopedics;  Laterality: Left;  Open Reduction Internal Fixation Left Ankle    No family history on file.  Social History:  reports that she quit smoking about 17 years ago. Her smoking use included Cigarettes. She smoked 0.25 packs per day. She does not have any smokeless tobacco history on file. She reports that she does not drink  alcohol or use illicit drugs. Exercise: total gym daily for 1 year  Allergies:  Allergies  Allergen Reactions  . Penicillins Anaphylaxis      Medication List       This list is accurate as of: 08/22/12  1:19 PM.  Always use your most recent med list.               aspirin 81 MG tablet  Take 81 mg by mouth daily.     levothyroxine 200 MCG tablet  Commonly known as:  SYNTHROID, LEVOTHROID  Take 200 mcg by mouth daily.     levothyroxine 175 MCG tablet  Commonly known as:  SYNTHROID, LEVOTHROID  Take 175 mcg by mouth daily before breakfast.        Review of Systems:  She has had a weight gain of about 20 lbs in the last year CARDIOLOGY: no history of high blood pressure.            GASTROENTEROLOGY:  constip  bowel habits.      ENDOCRINOLOGY:  has Diabetes as of her labs in 1/14 with fasting glucose 130 and A1c 6.8. However patient is not aware of this.   Menses are usually heavy   No numbness or tingling in her feet She has hypercholesterolemia with LDL in January of 160 along with low HDL of 37. Has never been on treatment  Examination:    BP 130/86  Pulse 78  Temp(Src) 98 F (36.7 C)  Resp 12  Ht 5\' 2"  (1.575 m)  Wt 234 lb 6.4 oz (106.323 kg)  BMI 42.86 kg/m2  SpO2 96%   General Appearance: pleasant, has generalized obesity without cushingoid features         Eyes: No proptosis or eyelid swelling, conjunctiva normal .          Neck: The thyroid is 1-1/2 times normal and firm on the right side. There is no lymphadenopathy .    Cardiovascular: Normal apex and heart sounds, no murmur Respiratory:  Lungs clear Gastrointestinal: abdomen soft, NT, ND, BS+      Neurological: REFLEXES: at biceps are normal.     Skin: moist, warm, no rashes        Assessments   1. Hypothyroidism.  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 Agree with the patient that she may benefit from a trial of a preparation like Armour thyroid instead of Synthroid. She can try this for about 6 weeks and if subjectively better she can continue it otherwise go back to Synthroid She will have thyroid levels checked today to help determine dosage of Armour Thyroid  2. Diabetes: She had a fasting glucose of 130 and A1c of 6.8. Currently she is unaware of this and she wants to followup with PCP for repeat testing. Also has significant obesity with continued weight gain but no cushingoid features. Does have mild increase in blood pressure today and also a low HDL possibly indicating metabolic syndrome. Discussed implications of mild diabetes and need for lifestyle changes and possibly medications  2. Hyperlipidemia, currently untreated with HDL 37 and LDL 160. With her diagnosis of diabetes would probably need pharmacological treatmen; discussed  4. She probably has mild depression but she does not want to consider treatment for this. This may be playing a role in her weight gain   Pat Elicker 08/22/2012, 1:19 PM

## 2012-08-23 ENCOUNTER — Other Ambulatory Visit: Payer: Self-pay | Admitting: *Deleted

## 2012-08-23 MED ORDER — THYROID 60 MG PO TABS: ORAL_TABLET | ORAL | Status: DC

## 2012-08-24 DIAGNOSIS — E785 Hyperlipidemia, unspecified: Secondary | ICD-10-CM | POA: Insufficient documentation

## 2012-09-25 ENCOUNTER — Other Ambulatory Visit (INDEPENDENT_AMBULATORY_CARE_PROVIDER_SITE_OTHER): Payer: BC Managed Care – PPO

## 2012-09-25 DIAGNOSIS — E039 Hypothyroidism, unspecified: Secondary | ICD-10-CM

## 2012-09-25 LAB — T4, FREE: Free T4: 0.76 ng/dL (ref 0.60–1.60)

## 2012-09-25 LAB — TSH: TSH: 1.22 u[IU]/mL (ref 0.35–5.50)

## 2012-10-03 ENCOUNTER — Ambulatory Visit (INDEPENDENT_AMBULATORY_CARE_PROVIDER_SITE_OTHER): Payer: BC Managed Care – PPO | Admitting: Endocrinology

## 2012-10-03 ENCOUNTER — Encounter: Payer: Self-pay | Admitting: Endocrinology

## 2012-10-03 VITALS — BP 124/80 | HR 80 | Temp 98.1°F | Resp 12 | Ht 62.0 in | Wt 233.8 lb

## 2012-10-03 DIAGNOSIS — E042 Nontoxic multinodular goiter: Secondary | ICD-10-CM

## 2012-10-03 DIAGNOSIS — E039 Hypothyroidism, unspecified: Secondary | ICD-10-CM

## 2012-10-03 NOTE — Progress Notes (Signed)
Patient ID: Raven Bradshaw, female   DOB: Jul 23, 1970, 42 y.o.   MRN: [id number]  Reason for Appointment:  Hypothyroidism, new visit    History of Present Illness:   BACKGROUND information: The Hyothyroidism was first diagnosed  about 10 years ago   The symptoms consistent with hypothyroidism at the time of diagnosis were: fatigue, difficulty with weight loss, hair loss.She also has had a multinodular goiter   She was first treated by her primary care physician and initially took only 25 mcg but this dosage was progressively increased over the years. On her initial visit she was on brand name Synthroid 175 mcg and had  been quite compliant with it  RECENT history: She does not think she has felt normal since she has had hypothyroidism. Because of her complaints of still feeling tired, tendency to weight gain despite normal thyroid levels she was given a trial of Armour Thyroid Was also having mood swings, insomnia and feeling irritable The last TSH was performed  on 02/14/12 and the result was 3.08  Since she started on Armour Thyroid she is feeling less tired although this was more pronounced in the first week. Also generally feels better with less headaches, less achiness and dizziness and not as much hair loss. She also has been treated by her primary care physician for insomnia at the same time    Past Medical History  Diagnosis Date  . Thyroid disease   . Hypothyroidism     Past Surgical History  Procedure Laterality Date  . Wisdom tooth extraction    . Tonsillectomy    . Orif ankle fracture  08/03/2011    Procedure: OPEN REDUCTION INTERNAL FIXATION (ORIF) ANKLE FRACTURE;  Surgeon: Nadara Mustard, MD;  Location: [facility] OR;  Service: Orthopedics;  Laterality: Left;  Open Reduction Internal Fixation Left Ankle    Family History  Problem Relation Age of Onset  . Diabetes Mother   . Heart disease Mother   . Hyperlipidemia Mother   . Heart disease Father   . Diabetes Maternal  Grandmother     Social History:  reports that she quit smoking about 17 years ago. Her smoking use included Cigarettes. She smoked 0.25 packs per day. She does not have any smokeless tobacco history on file. She reports that she does not drink alcohol or use illicit drugs. Exercise: total gym daily for 1 year  Allergies:  Allergies  Allergen Reactions  . Penicillins Anaphylaxis      Medication List       This list is accurate as of: 10/03/12  9:43 AM.  Always use your most recent med list.               aspirin 81 MG tablet  Take 81 mg by mouth daily.     hydrOXYzine 25 MG capsule  Commonly known as:  VISTARIL  25 mg.     Iron (Ferrous Gluconate) 256 (28 FE) MG Tabs  Take 650 mg by mouth.     multivitamin with minerals Tabs tablet  Take 1 tablet by mouth daily.     thyroid 60 MG tablet  Commonly known as:  ARMOUR THYROID  Take 2 1/2 tablets daily        Review of Systems:  She has had a weight gain of about 20 lbs in the last year     ENDOCRINOLOGY:  has Diabetes as of her labs in 1/14 with fasting glucose 130 and A1c 6.8. She thinks her repeat labs recently were  better and she is going to see a dietitian  Multinodular goiter 2010, largest nodule in the left 26 mm and biopsy shows mostly lymphocytes and a few Hurthle cells  She has hypercholesterolemia with LDL in January of 160 along with low HDL of 37. Has never been on treatment        Examination:    BP 124/80  Pulse 80  Temp(Src) 98.1 F (36.7 C)  Resp 12  Ht 5\' 2"  (1.575 m)  Wt 233 lb 12.8 oz (106.051 kg)  BMI 42.75 kg/m2  SpO2 96%  LMP 09/09/2012  Neurological: REFLEXES: at biceps are normal.      Assessments Roosvelt Harps:  Lab Results  Component Value Date   TSH 1.22 09/25/2012     Hypothyroidism.  She has improvement in her symptoms with switching to Armour Thyroid and will continue this as her TSH is good  She is going to followup with her primary care physician for other issues regarding to  her mild diabetes, hyperlipidemia and depression/obesity  Lucy Woolever 10/03/2012, 9:43 AM

## 2012-10-03 NOTE — Patient Instructions (Signed)
Same dose 

## 2012-10-10 ENCOUNTER — Other Ambulatory Visit: Payer: Self-pay | Admitting: *Deleted

## 2012-10-10 MED ORDER — THYROID 60 MG PO TABS: ORAL_TABLET | ORAL | Status: AC

## 2012-12-24 ENCOUNTER — Other Ambulatory Visit: Payer: BC Managed Care – PPO

## 2012-12-31 ENCOUNTER — Ambulatory Visit: Payer: BC Managed Care – PPO | Admitting: Endocrinology

## 2013-01-16 ENCOUNTER — Other Ambulatory Visit: Payer: Self-pay | Admitting: Endocrinology

## 2013-06-24 IMAGING — CR DG ANKLE COMPLETE 3+V*L*
3 series · 3 of 3 positions shown · non-contrast
Comparison: Ankle radiographs 08/02/2011.

CLINICAL DATA: Ankle fracture.  Postreduction film.

LEFT ANKLE COMPLETE - 3+ VIEW

[x ankle ap left]
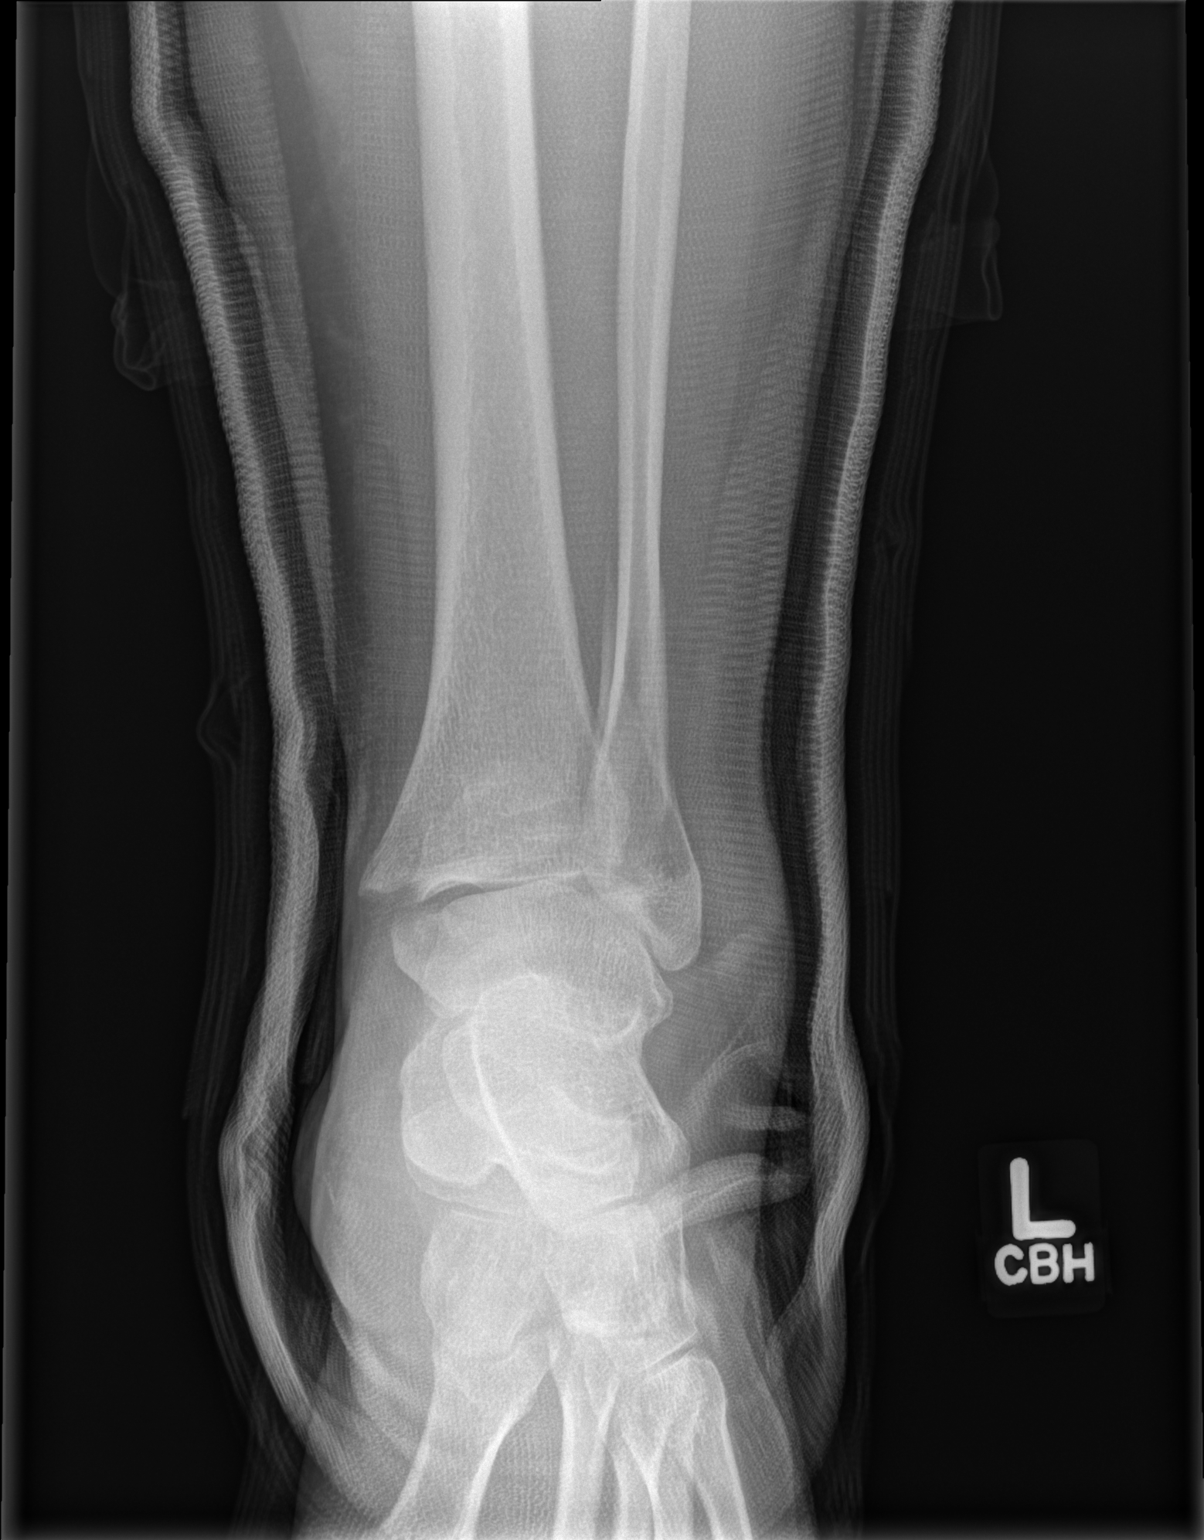

[x ankle obl left]
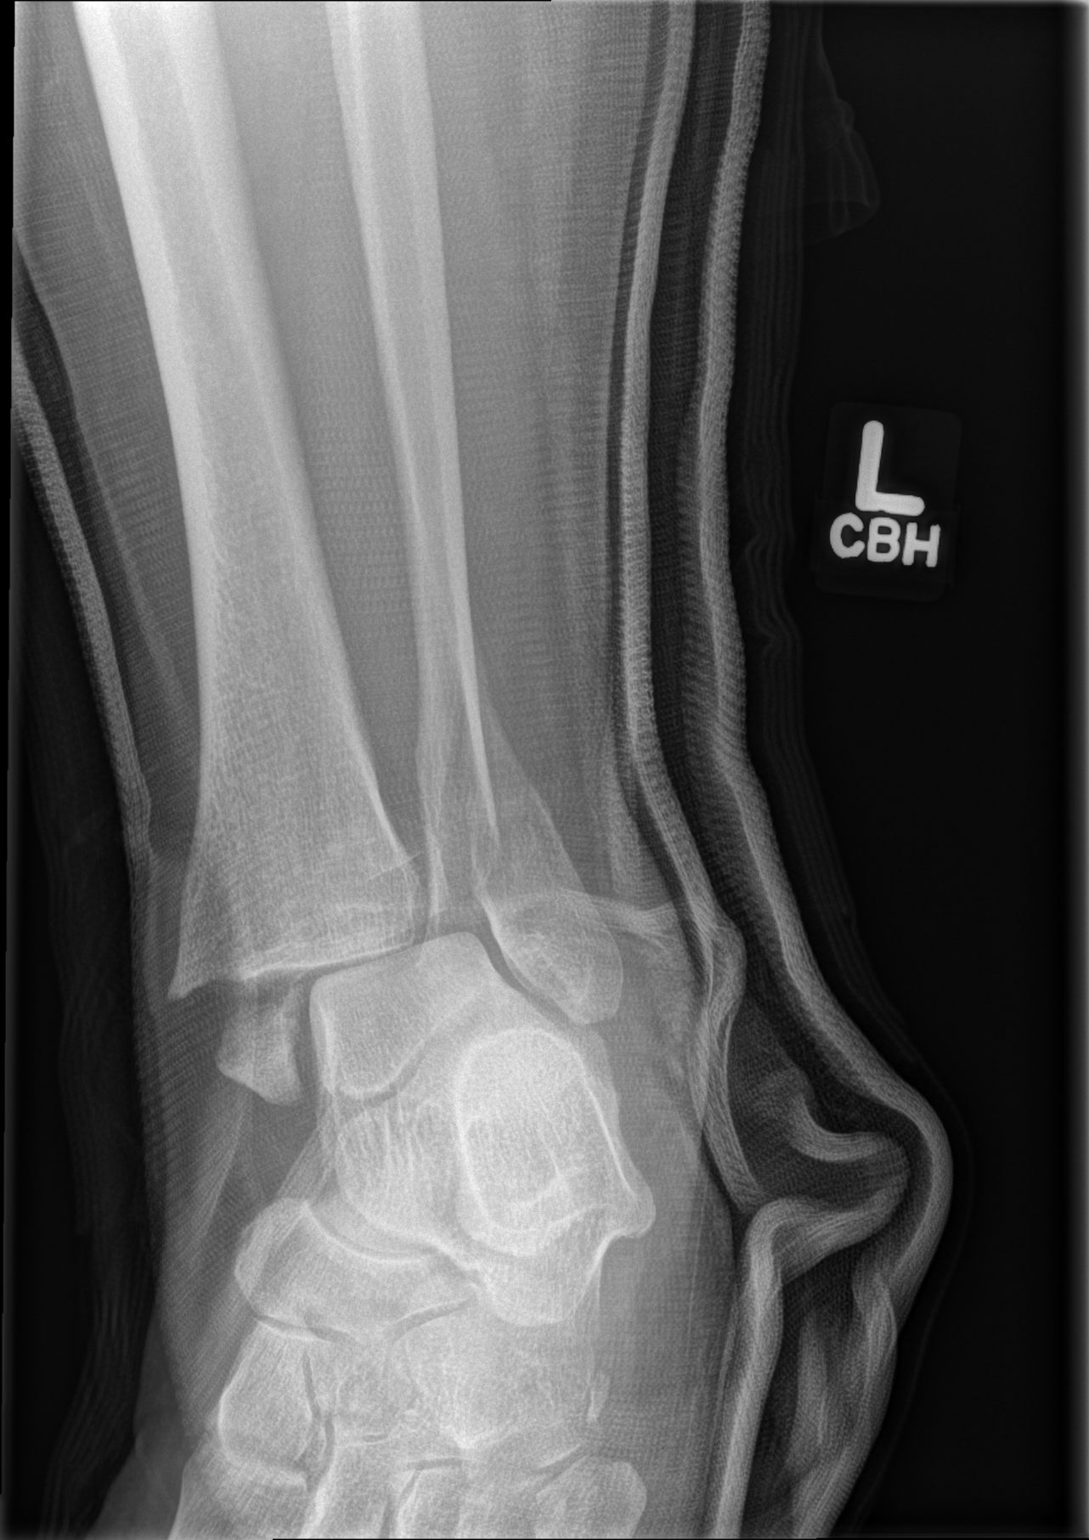

[x ankle lat left]
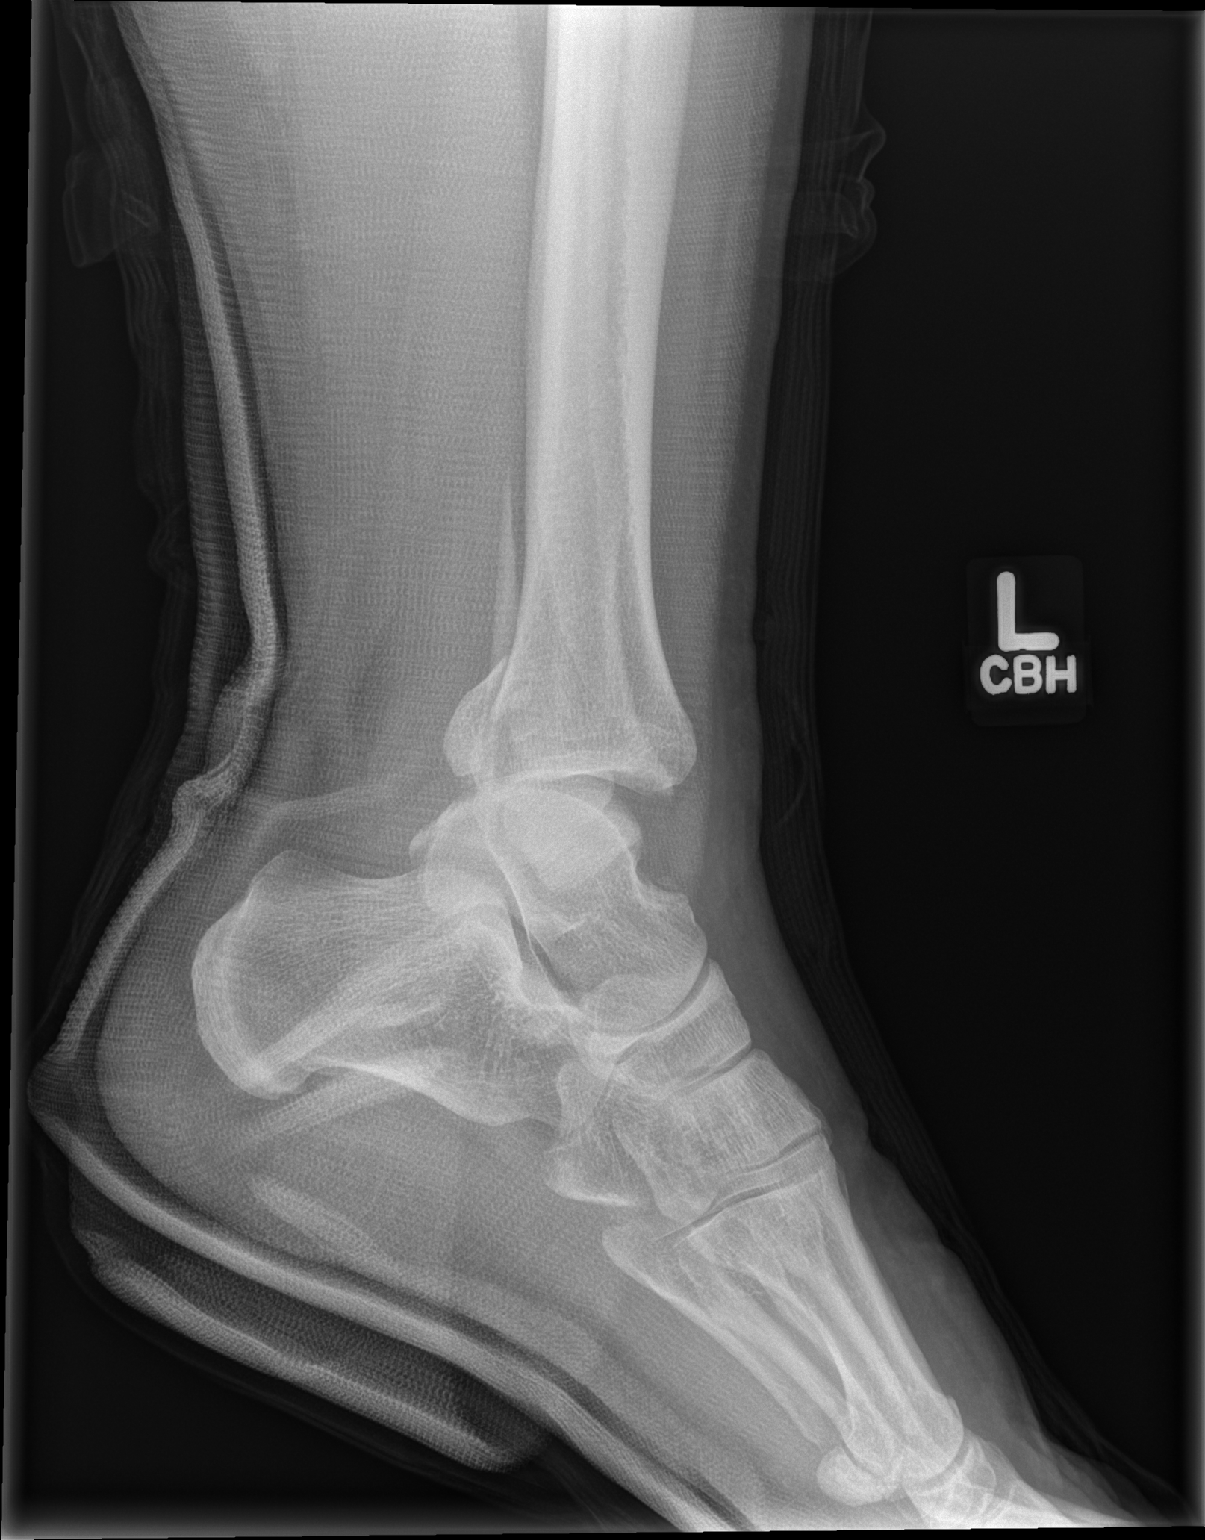

[3 of 3 positions shown; findings below may reference images not displayed]

FINDINGS: Again noted is a trimalleolar fracture of the left ankle,
with significant displacement and angulation of all of the fracture
fragments.  There continues to be some posterior subluxation of the
talus relative to the distal tibia.  Overlying soft tissue swelling
is also noted.  No significant improvement in alignment is
appreciated.  Overlying splint material is now in place.
IMPRESSION: 1.  No significant change in alignment of unstable trimalleolar
fracture of the left ankle following splint placement, as above.

## 2013-06-24 IMAGING — CR DG ANKLE COMPLETE 3+V*L*
3 series · 3 of 3 positions shown · non-contrast
Comparison: None.

CLINICAL DATA: Fall and left ankle pain.

LEFT ANKLE COMPLETE - 3+ VIEW

[x ankle ap left]
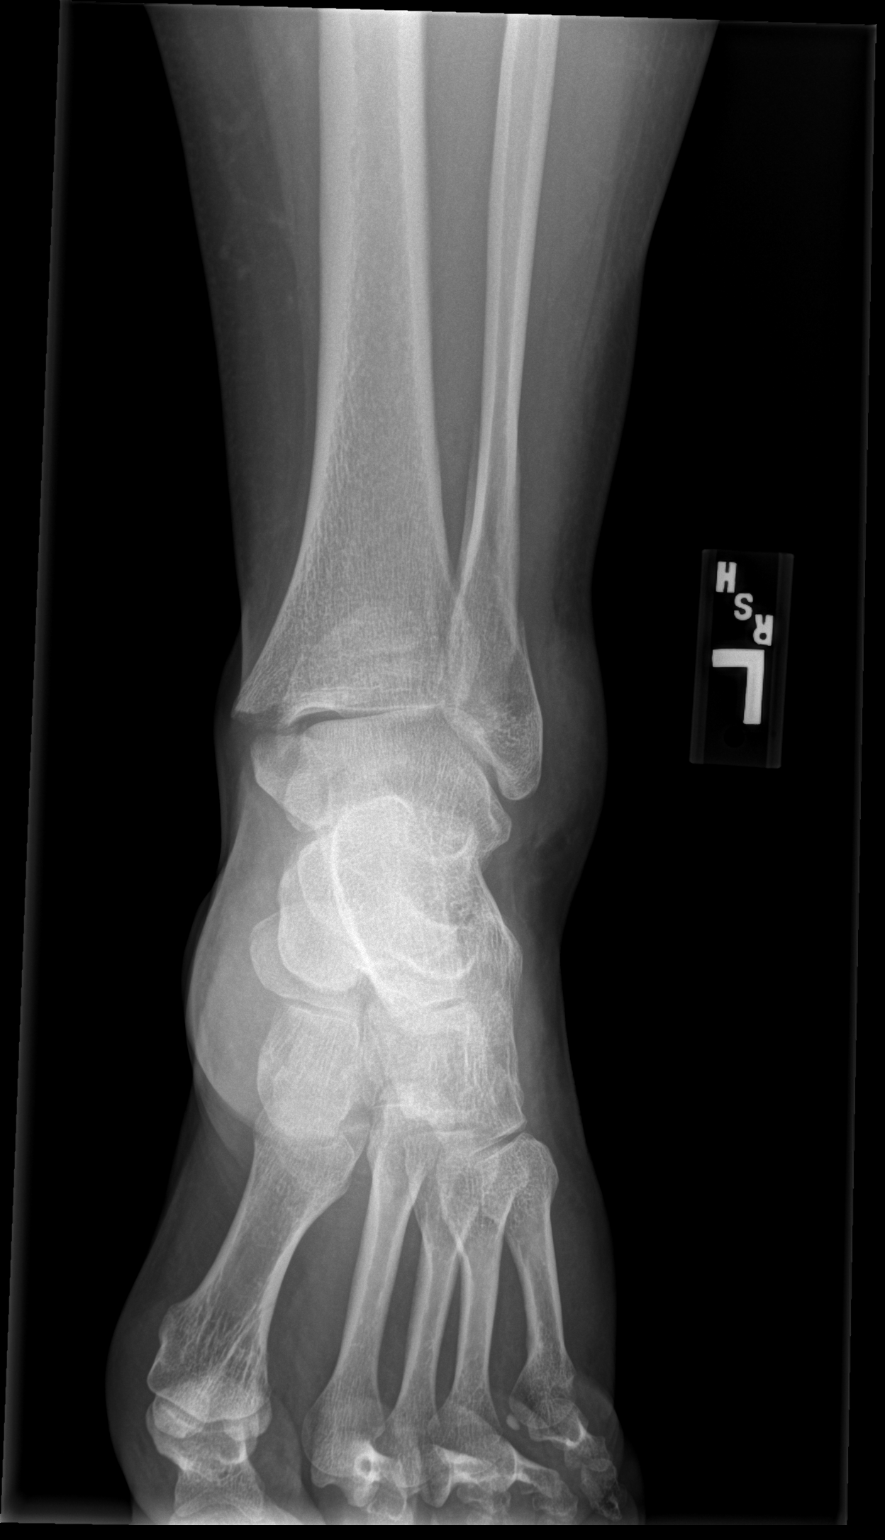

[x ankle obl left]
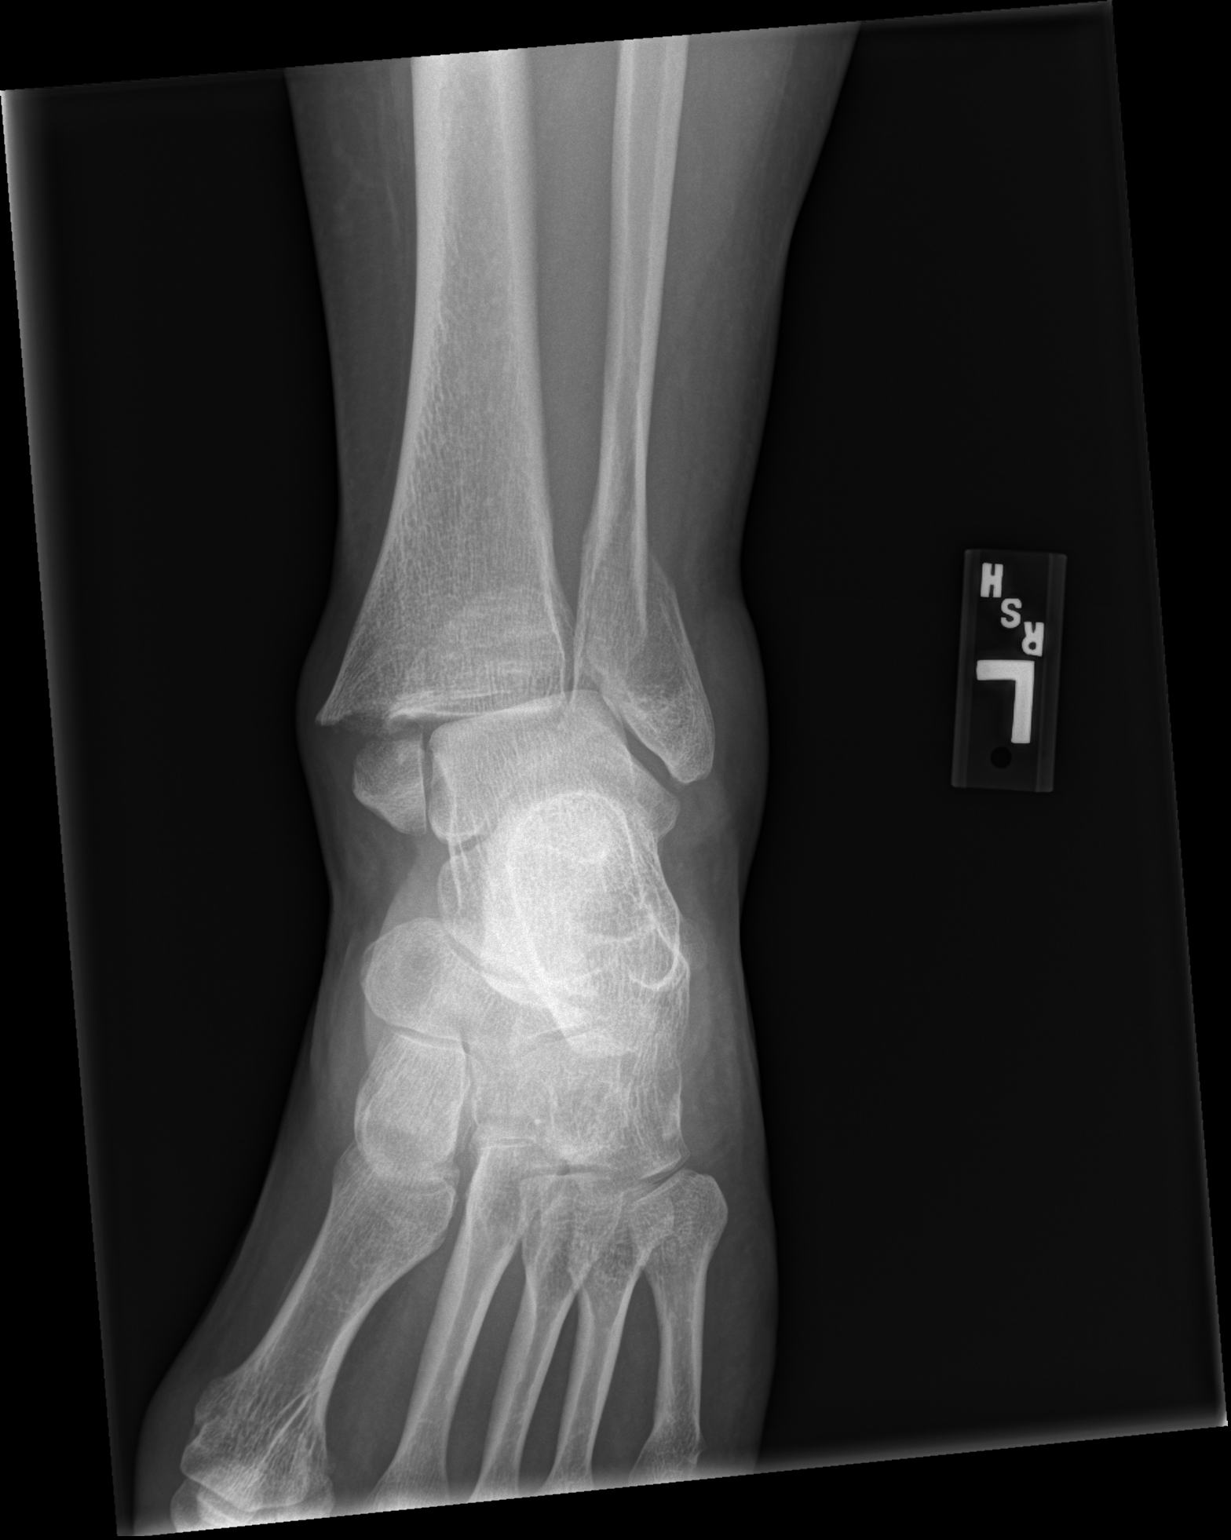

[x ankle lat left]
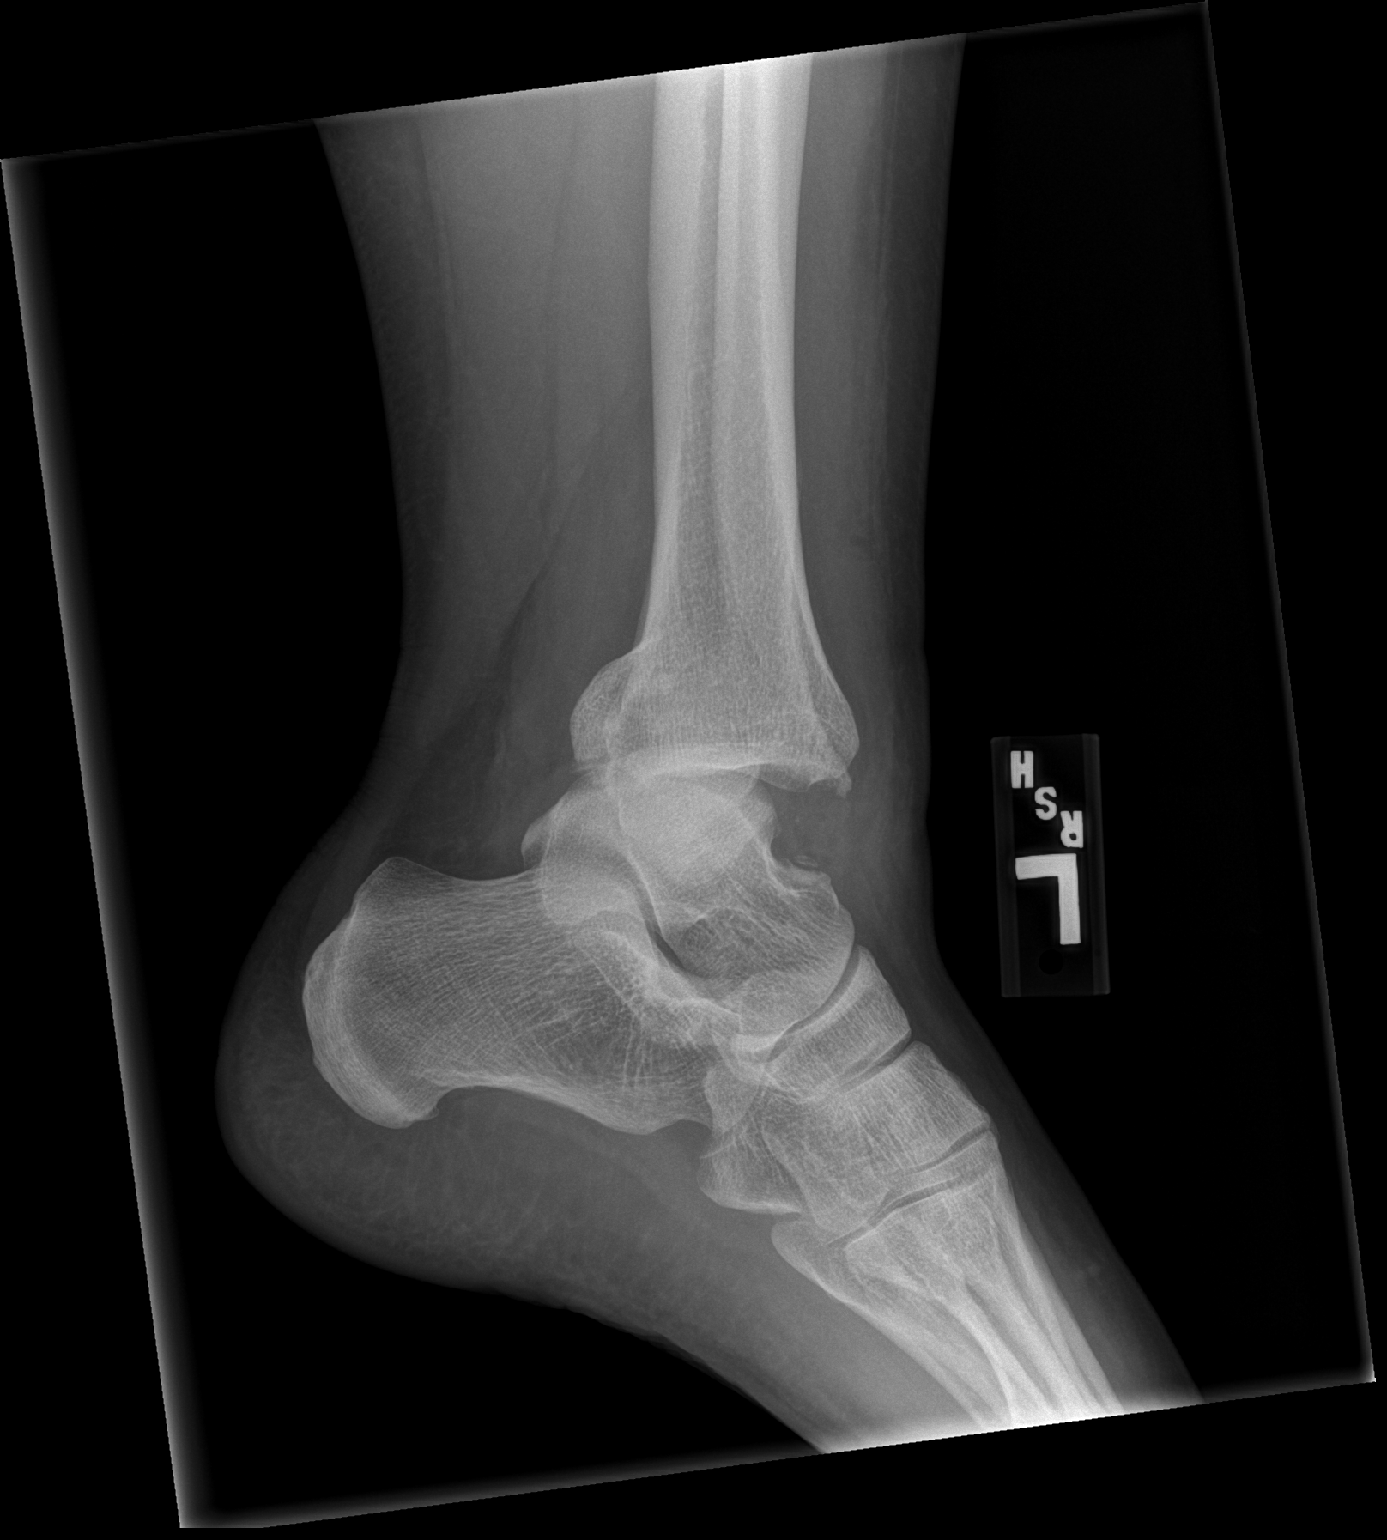

[3 of 3 positions shown; findings below may reference images not displayed]

FINDINGS: Three views of the left ankle demonstrate a complex
fracture of the left ankle.  There is a fracture at the medial
malleolus at the level of the ankle joint.  There is a displaced
fracture involving the distal fibula.  There is disruption of the
ankle mortise. There is marked soft tissue swelling, particularly
along the lateral aspect.  There may be an avulsion type fracture
along the dorsal aspect of the talus. There appears to be a
fracture involving the posterior distal tibia.
IMPRESSION: Trimalleolar ankle fracture with disruption of the
ankle mortise.

## 2013-06-25 IMAGING — CR DG CHEST 2V
2 series · 2 of 2 positions shown · non-contrast
Comparison: None

CLINICAL DATA: Preop ankle surgery.

CHEST - 2 VIEW

[view not recorded (1 of 2)]
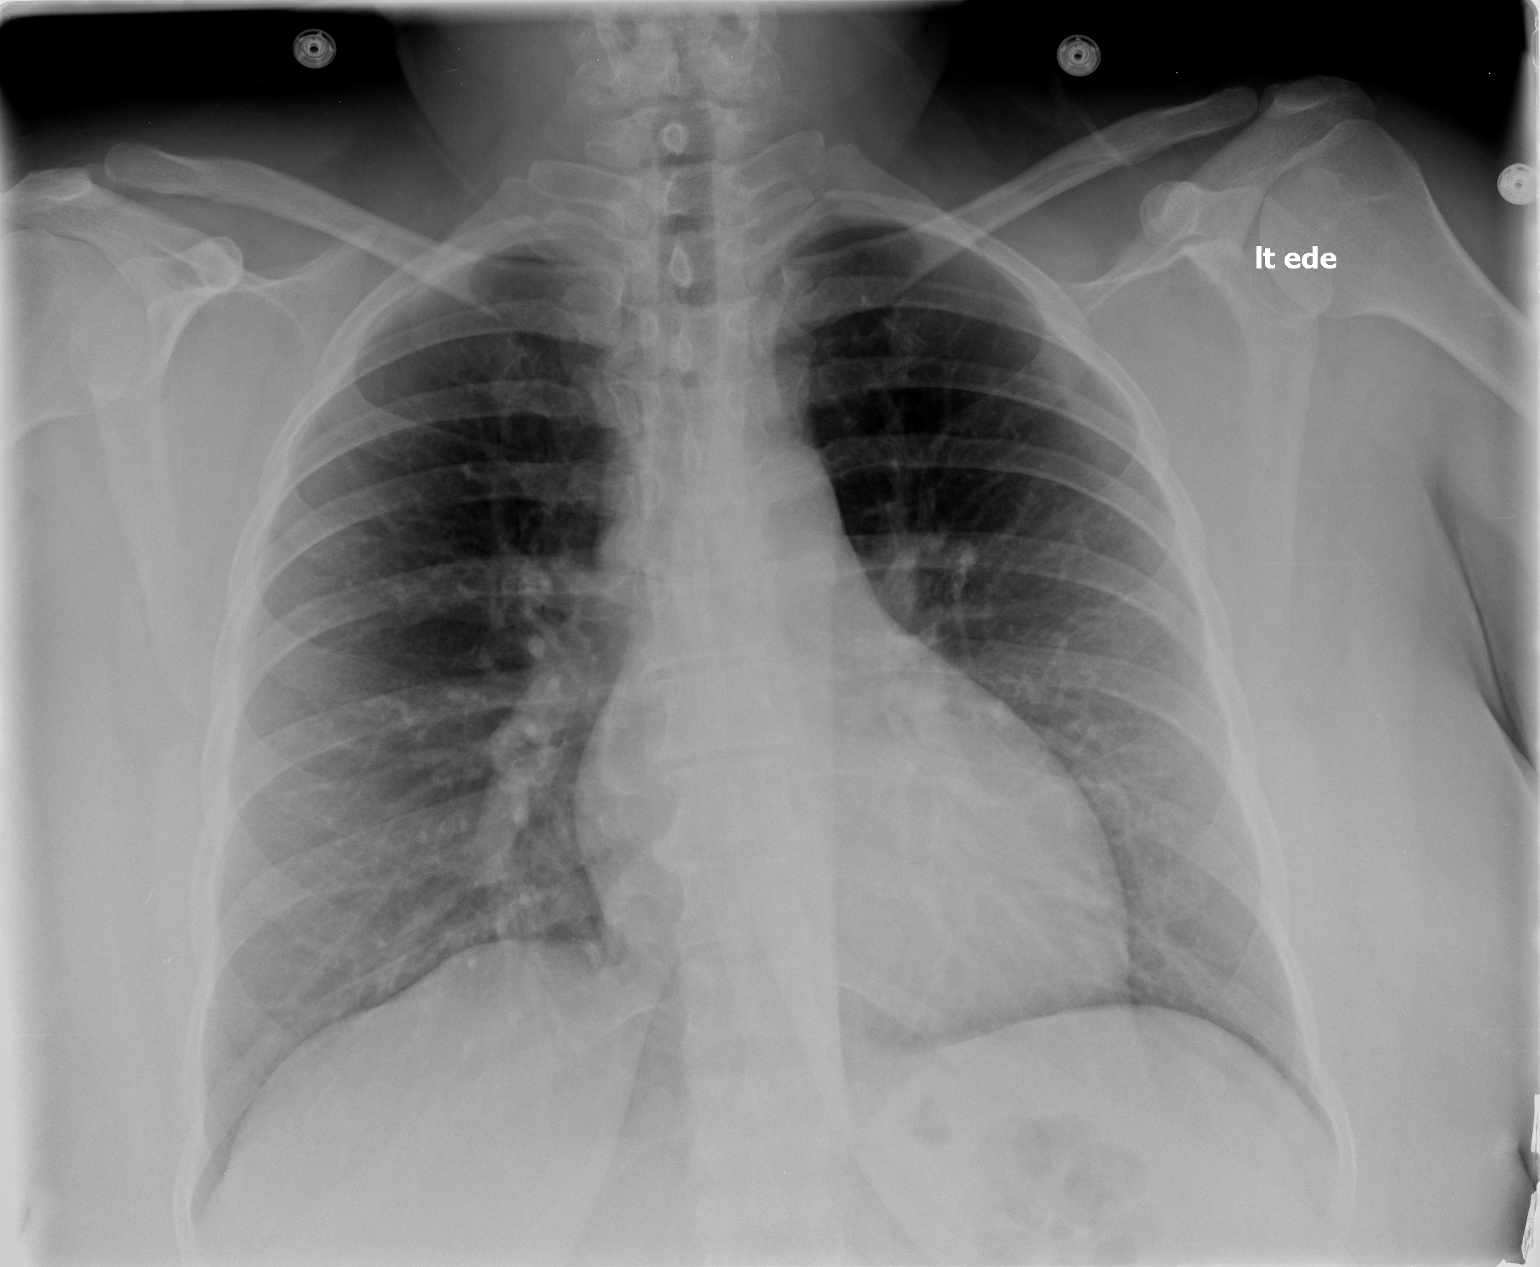

[view not recorded (2 of 2)]
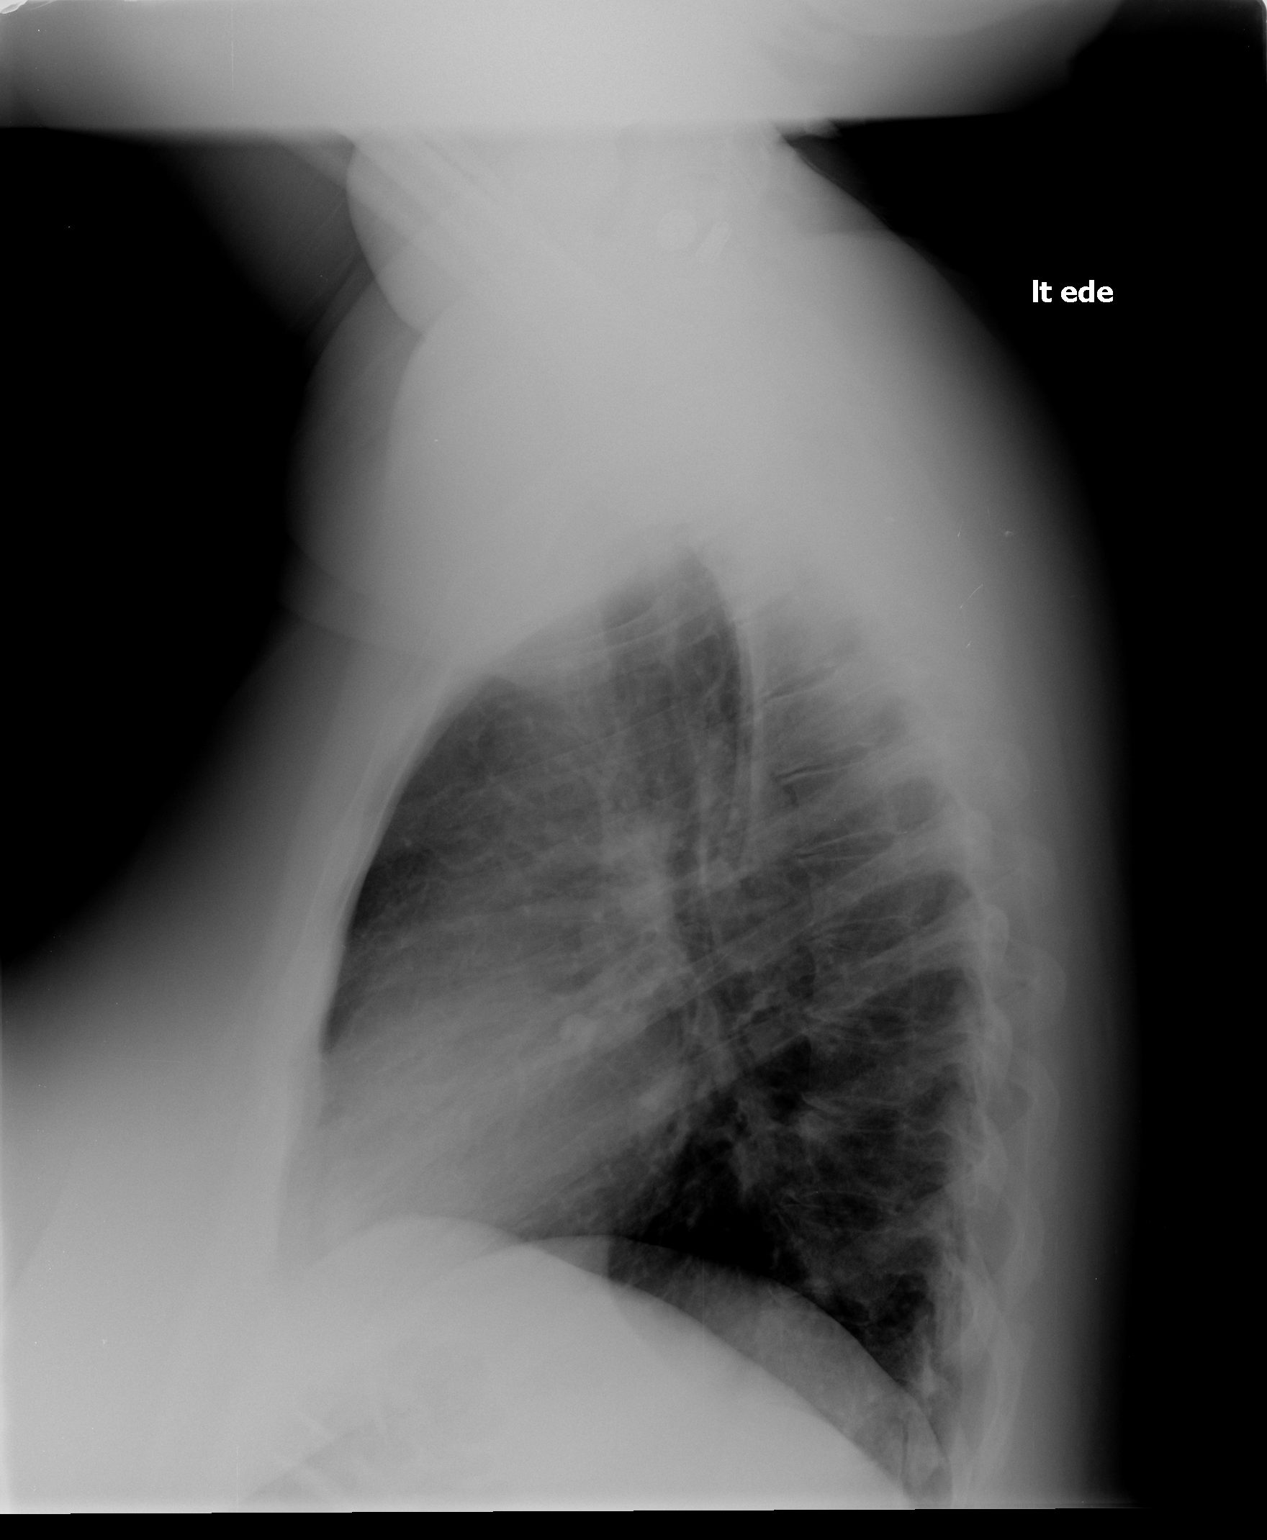

[2 of 2 positions shown; findings below may reference images not displayed]

FINDINGS: Heart and mediastinal contours are within normal limits.
No focal opacities or effusions.  No acute bony abnormality.
IMPRESSION: No active cardiopulmonary disease.

## 2013-09-26 ENCOUNTER — Telehealth: Payer: Self-pay

## 2013-09-27 NOTE — Telephone Encounter (Signed)
There is no indication that this pt has diabetes. No labs or meds to support diagnosis. Requesting the diagnosis of diabetes to be removed.

## 2013-09-27 NOTE — Telephone Encounter (Signed)
Diabetes is followed by her primary physician, I am seeing her only for thyroid

## 2015-12-13 ENCOUNTER — Ambulatory Visit (INDEPENDENT_AMBULATORY_CARE_PROVIDER_SITE_OTHER): Payer: Self-pay | Admitting: Orthopedic Surgery

## 2015-12-24 ENCOUNTER — Ambulatory Visit (INDEPENDENT_AMBULATORY_CARE_PROVIDER_SITE_OTHER): Payer: Managed Care, Other (non HMO)

## 2015-12-24 ENCOUNTER — Ambulatory Visit (INDEPENDENT_AMBULATORY_CARE_PROVIDER_SITE_OTHER): Payer: Managed Care, Other (non HMO) | Admitting: Orthopedic Surgery

## 2015-12-24 VITALS — Ht 62.0 in | Wt 270.0 lb

## 2015-12-24 DIAGNOSIS — M25572 Pain in left ankle and joints of left foot: Secondary | ICD-10-CM

## 2015-12-24 MED ORDER — LIDOCAINE HCL 1 % IJ SOLN
2.0000 mL | INTRAMUSCULAR | Status: AC | PRN
  Administered 2015-12-24: 2 mL

## 2015-12-24 MED ORDER — METHYLPREDNISOLONE ACETATE 40 MG/ML IJ SUSP
40.0000 mg | INTRAMUSCULAR | Status: AC | PRN
  Administered 2015-12-24: 40 mg via INTRA_ARTICULAR

## 2015-12-24 NOTE — Progress Notes (Signed)
Office Visit Note   Patient: Raven Bradshaw           Date of Birth: 1970-04-07           MRN: [id number] Visit Date: 12/24/2015              Requested by: Maris Bergerowena Sistasis, MD [address] [place], [place] [id number] PCP: Maris BergerSISTASIS,ROWENA, MD   Assessment & Plan: Visit Diagnoses:  1. Pain in left ankle and joints of left foot   Impingement syndrome left ankle  Plan: Patient's ankle was injected from the anterior medial portal she tolerated this well follow-up in 4 weeks. Discussed that if she gets some temporary relief we can consider arthroscopic intervention for impingement syndrome.  Follow-Up Instructions: Return in about 4 weeks (around 01/21/2016).   Orders:  Orders Placed This Encounter  Procedures  . Medium Joint Injection/Arthrocentesis  . XR Ankle Complete Left   No orders of the defined types were placed in this encounter.     Procedures: Medium Joint Inj Date/Time: 12/24/2015 11:29 AM Performed by: Dakhari Zuver V Authorized by: Nadara MustardUDA, Murphy Bundick V   Consent Given by:  Patient Site marked: the procedure site was marked   Timeout: prior to procedure the correct patient, procedure, and site was verified   Indications:  Pain and diagnostic evaluation Location:  Ankle Site:  L ankle Prep: patient was prepped and draped in usual sterile fashion   Needle Size:  22 G Needle Length:  1.5 inches Approach:  Anteromedial Ultrasound Guided: No   Fluoroscopic Guidance: No   Medications:  2 mL lidocaine 1 %; 40 mg methylPREDNISolone acetate 40 MG/ML Aspiration Attempted: No   Patient tolerance:  Patient tolerated the procedure well with no immediate complications     Clinical Data: No additional findings.   Subjective: Chief Complaint  Patient presents with  . Left Ankle - Pain    08/03/11 s/p left ORIF left trimal ankle fracture    Patient has a history of surgery 08/03/11 s/p left ORIF left trimal ankle fracture. States for the past few months that she has been  having pain and instability with walking and that there was no injury to this ankle since surgery but that she is having difficulty and wants to make sure that ankle "is ok"      Review of Systems   Objective: Vital Signs: Ht 5\' 2"  (1.575 m)   Wt 270 lb (122.5 kg)   BMI 49.38 kg/m   Physical Exam examination patient is alert oriented no adenopathy well-dressed normal affect numerous to effort she has an antalgic gait she has good dorsiflexion the ankle she has good pulses she is tender to palpation anterior medial joint line. Anterior drawer is stable. There is no redness no cellulitis small amount of swelling no signs of infection.  Ortho Exam  Specialty Comments:  No specialty comments available.  Imaging: Xr Ankle Complete Left  Result Date: 12/24/2015 Three-view radiographs of the left ankle show stable internal fixation of the left bimalleolar ankle fracture. There is some mild joint space narrowing no evidence of any osteochondral defects no complications with the hardware.    PMFS History: Patient Active Problem List   Diagnosis Date Noted  . Other and unspecified hyperlipidemia 08/24/2012  . Unspecified hypothyroidism 08/22/2012  . Multinodular goiter 08/22/2012  . Diabetes (HCC) 08/22/2012   Past Medical History:  Diagnosis Date  . Hypothyroidism   . Thyroid disease     Family History  Problem Relation Age of  Onset  . Diabetes Mother   . Heart disease Mother   . Hyperlipidemia Mother   . Heart disease Father   . Diabetes Maternal Grandmother     Past Surgical History:  Procedure Laterality Date  . ORIF ANKLE FRACTURE  08/03/2011   Procedure: OPEN REDUCTION INTERNAL FIXATION (ORIF) ANKLE FRACTURE;  Surgeon: Nadara MustardMarcus V Anuja Manka, MD;  Location: [facility] OR;  Service: Orthopedics;  Laterality: Left;  Open Reduction Internal Fixation Left Ankle  . TONSILLECTOMY    . WISDOM TOOTH EXTRACTION     Social History   Occupational History  . Not on file.   Social History  Main Topics  . Smoking status: Former Smoker    Packs/day: 0.25    Types: Cigarettes    Quit date: 08/03/1995  . Smokeless tobacco: Not on file  . Alcohol use No  . Drug use: No  . Sexual activity: Yes    Birth control/ protection: Condom

## 2016-01-21 ENCOUNTER — Encounter (INDEPENDENT_AMBULATORY_CARE_PROVIDER_SITE_OTHER): Payer: Self-pay | Admitting: Family

## 2016-01-21 ENCOUNTER — Ambulatory Visit (INDEPENDENT_AMBULATORY_CARE_PROVIDER_SITE_OTHER): Payer: Managed Care, Other (non HMO) | Admitting: Orthopedic Surgery

## 2016-01-21 ENCOUNTER — Ambulatory Visit (INDEPENDENT_AMBULATORY_CARE_PROVIDER_SITE_OTHER): Payer: Managed Care, Other (non HMO) | Admitting: Family

## 2016-01-21 DIAGNOSIS — M7752 Other enthesopathy of left foot: Secondary | ICD-10-CM

## 2016-01-21 DIAGNOSIS — M25872 Other specified joint disorders, left ankle and foot: Secondary | ICD-10-CM

## 2016-01-21 NOTE — Progress Notes (Signed)
Office Visit Note   Patient: Raven HiltsShannon Oquendo           Date of Birth: 1970/05/19           MRN: [id number] Visit Date: 01/21/2016              Requested by: Maris Bergerowena Sistasis, MD [address] [place], [place] [id number] PCP: Maris BergerSISTASIS,ROWENA, MD   Assessment & Plan: Visit Diagnoses:  1. Impingement syndrome of left ankle     Plan: Follow up as needed. advised we will defer arthroscopy as we have had good relief from steroid injection at this time.  Follow-Up Instructions: Return if symptoms worsen or fail to improve.   Orders:  No orders of the defined types were placed in this encounter.  No orders of the defined types were placed in this encounter.     Procedures: No procedures performed   Clinical Data: No additional findings.   Subjective: Chief Complaint  Patient presents with  . Left Ankle - Follow-up    Patient is a 45 year old woman who presents today in follow-up for left ankle pain status post injection on 12/24/15 for impingement. She had ORIF left ankle earlier this year. She is doing much better, she has had excellent relief. There is very minimal pain and overall she is feeling good. No concerns today.    Review of Systems  Constitutional: Negative for chills and fever.     Objective: Vital Signs: There were no vitals taken for this visit.  Physical Exam  Constitutional: She is oriented to person, place, and time. She appears well-developed and well-nourished.  Pulmonary/Chest: Effort normal.  Neurological: She is alert and oriented to person, place, and time.  Psychiatric: She has a normal mood and affect.  Nursing note reviewed.   Left Ankle Exam  Swelling: none  Tenderness  The patient is experiencing no tenderness.   Range of Motion  The patient has normal left ankle ROM.   Muscle Strength  The patient has normal left ankle strength.  Tests  Varus tilt: negative      Specialty Comments:  No specialty comments  available.  Imaging: No results found.   PMFS History: Patient Active Problem List   Diagnosis Date Noted  . Impingement syndrome of left ankle 01/21/2016  . Other and unspecified hyperlipidemia 08/24/2012  . Unspecified hypothyroidism 08/22/2012  . Multinodular goiter 08/22/2012  . Diabetes (HCC) 08/22/2012   Past Medical History:  Diagnosis Date  . Hypothyroidism   . Thyroid disease     Family History  Problem Relation Age of Onset  . Diabetes Mother   . Heart disease Mother   . Hyperlipidemia Mother   . Heart disease Father   . Diabetes Maternal Grandmother     Past Surgical History:  Procedure Laterality Date  . ORIF ANKLE FRACTURE  08/03/2011   Procedure: OPEN REDUCTION INTERNAL FIXATION (ORIF) ANKLE FRACTURE;  Surgeon: Nadara MustardMarcus V Duda, MD;  Location: [facility] OR;  Service: Orthopedics;  Laterality: Left;  Open Reduction Internal Fixation Left Ankle  . TONSILLECTOMY    . WISDOM TOOTH EXTRACTION     Social History   Occupational History  . Not on file.   Social History Main Topics  . Smoking status: Former Smoker    Packs/day: 0.25    Types: Cigarettes    Quit date: 08/03/1995  . Smokeless tobacco: Not on file  . Alcohol use No  . Drug use: No  . Sexual activity: Yes    Birth control/ protection:  Condom

## 2016-08-30 ENCOUNTER — Ambulatory Visit (INDEPENDENT_AMBULATORY_CARE_PROVIDER_SITE_OTHER): Payer: Managed Care, Other (non HMO) | Admitting: Orthopedic Surgery

## 2016-08-30 DIAGNOSIS — M25872 Other specified joint disorders, left ankle and foot: Secondary | ICD-10-CM

## 2016-08-30 MED ORDER — LIDOCAINE HCL 1 % IJ SOLN
2.0000 mL | INTRAMUSCULAR | Status: AC | PRN
  Administered 2016-08-30: 2 mL

## 2016-08-30 MED ORDER — METHYLPREDNISOLONE ACETATE 40 MG/ML IJ SUSP
40.0000 mg | INTRAMUSCULAR | Status: AC | PRN
  Administered 2016-08-30: 40 mg via INTRA_ARTICULAR

## 2016-08-30 NOTE — Progress Notes (Signed)
Office Visit Note   Patient: Raven Bradshaw           Date of Birth: 09-Dec-1970           MRN: [id number] Visit Date: 08/30/2016              Requested by: Maris BergerSistasis, Rowena, MD [address] [place] [id number] PCP: Maris BergerSistasis, Rowena, MD  Chief Complaint  Patient presents with  . Left Ankle - Pain      HPI: Patient is a 46 year old woman status post open reduction internal fixation left ankle fracture who was had good temporary relief with previous steroid injections she complains of medial joint line shooting pain with her ankle giving out.  Assessment & Plan: Visit Diagnoses:  1. Impingement syndrome of left ankle     Plan: The left ankle was injected from the anterior medial portal follow-up as needed discussed the possibility of arthroscopic intervention.  Follow-Up Instructions: Return if symptoms worsen or fail to improve.   Ortho Exam  Patient is alert, oriented, no adenopathy, well-dressed, normal affect, normal respiratory effort. Examination patient has an antalgic gait she is point tender to palpation over the anterior joint line maximal dorsiflexion the ankle reproduces pain anterior drawer is stable there is no redness no cellulitis no swelling.  Imaging: No results found.  Labs: No results found for: HGBA1C, ESRSEDRATE, CRP, LABURIC, REPTSTATUS, GRAMSTAIN, CULT, LABORGA  Orders:  No orders of the defined types were placed in this encounter.  No orders of the defined types were placed in this encounter.    Procedures: Medium Joint Inj Date/Time: 08/30/2016 3:19 PM Performed by: Talor Desrosiers V Authorized by: Nadara MustardUDA, Deloyd Handy V   Consent Given by:  Patient Site marked: the procedure site was marked   Timeout: prior to procedure the correct patient, procedure, and site was verified   Indications:  Pain and diagnostic evaluation Location:  Ankle Site:  L ankle Prep: patient was prepped and draped in usual sterile fashion   Needle Size:  22 G Needle  Length:  1.5 inches Approach:  Anteromedial Ultrasound Guided: No   Fluoroscopic Guidance: No   Medications:  2 mL lidocaine 1 %; 40 mg methylPREDNISolone acetate 40 MG/ML Aspiration Attempted: No   Patient tolerance:  Patient tolerated the procedure well with no immediate complications    Clinical Data: No additional findings.  ROS:  All other systems negative, except as noted in the HPI. Review of Systems  Objective: Vital Signs: There were no vitals taken for this visit.  Specialty Comments:  No specialty comments available.  PMFS History: Patient Active Problem List   Diagnosis Date Noted  . Impingement syndrome of left ankle 01/21/2016  . Other and unspecified hyperlipidemia 08/24/2012  . Unspecified hypothyroidism 08/22/2012  . Multinodular goiter 08/22/2012  . Diabetes (HCC) 08/22/2012   Past Medical History:  Diagnosis Date  . Hypothyroidism   . Thyroid disease     Family History  Problem Relation Age of Onset  . Diabetes Mother   . Heart disease Mother   . Hyperlipidemia Mother   . Heart disease Father   . Diabetes Maternal Grandmother     Past Surgical History:  Procedure Laterality Date  . ORIF ANKLE FRACTURE  08/03/2011   Procedure: OPEN REDUCTION INTERNAL FIXATION (ORIF) ANKLE FRACTURE;  Surgeon: Nadara MustardMarcus V Anuel Sitter, MD;  Location: [facility] OR;  Service: Orthopedics;  Laterality: Left;  Open Reduction Internal Fixation Left Ankle  . TONSILLECTOMY    . WISDOM TOOTH EXTRACTION  Social History   Occupational History  . Not on file.   Social History Main Topics  . Smoking status: Former Smoker    Packs/day: 0.25    Types: Cigarettes    Quit date: 08/03/1995  . Smokeless tobacco: Not on file  . Alcohol use No  . Drug use: No  . Sexual activity: Yes    Birth control/ protection: Condom

## 2016-10-17 ENCOUNTER — Ambulatory Visit (INDEPENDENT_AMBULATORY_CARE_PROVIDER_SITE_OTHER): Payer: Managed Care, Other (non HMO) | Admitting: Orthopedic Surgery
